# Patient Record
Sex: Female | Born: 1955 | Hispanic: No | Marital: Single | Smoking: Never smoker
Health system: Southern US, Community
[De-identification: ages and names within clinical notes are randomized; demographics above are authoritative.]

## PROBLEM LIST (undated history)

## (undated) DIAGNOSIS — S52501A Unspecified fracture of the lower end of right radius, initial encounter for closed fracture: Secondary | ICD-10-CM

## (undated) HISTORY — PX: ABDOMINAL SURGERY: SHX537

---

## 2002-01-04 ENCOUNTER — Ambulatory Visit (HOSPITAL_BASED_OUTPATIENT_CLINIC_OR_DEPARTMENT_OTHER): Admission: RE | Admit: 2002-01-04 | Discharge: 2002-01-04 | Payer: Self-pay | Admitting: Orthopedic Surgery

## 2011-01-01 ENCOUNTER — Emergency Department (HOSPITAL_BASED_OUTPATIENT_CLINIC_OR_DEPARTMENT_OTHER)
Admission: EM | Admit: 2011-01-01 | Discharge: 2011-01-01 | Disposition: A | Discharge status: Home / Self Care | Payer: 59 | Attending: Emergency Medicine | Admitting: Emergency Medicine

## 2011-01-01 ENCOUNTER — Encounter: Payer: Self-pay | Admitting: Family Medicine

## 2011-01-01 DIAGNOSIS — S41112A Laceration without foreign body of left upper arm, initial encounter: Secondary | ICD-10-CM

## 2011-01-01 DIAGNOSIS — S41109A Unspecified open wound of unspecified upper arm, initial encounter: Secondary | ICD-10-CM | POA: Insufficient documentation

## 2011-01-01 DIAGNOSIS — Y93H2 Activity, gardening and landscaping: Secondary | ICD-10-CM | POA: Insufficient documentation

## 2011-01-01 DIAGNOSIS — W010XXA Fall on same level from slipping, tripping and stumbling without subsequent striking against object, initial encounter: Secondary | ICD-10-CM | POA: Insufficient documentation

## 2011-01-01 DIAGNOSIS — Y92009 Unspecified place in unspecified non-institutional (private) residence as the place of occurrence of the external cause: Secondary | ICD-10-CM | POA: Insufficient documentation

## 2011-01-01 MED ORDER — HYDROCODONE-ACETAMINOPHEN 5-325 MG PO TABS: 2.0000 | ORAL_TABLET | ORAL | Status: AC | PRN

## 2011-01-01 MED ORDER — LIDOCAINE HCL 2 % IJ SOLN
INTRAMUSCULAR | Status: AC
  Administered 2011-01-01: 400 mg
  Filled 2011-01-01: qty 1

## 2011-01-01 MED ORDER — LIDOCAINE HCL 2 % IJ SOLN
20.0000 mL | Freq: Once | INTRAMUSCULAR | Status: AC
  Administered 2011-01-01: 400 mg

## 2011-01-01 NOTE — ED Notes (Signed)
Pt has large deep laceration to LUE from a tree.

## 2011-01-01 NOTE — ED Provider Notes (Signed)
History     CSN: [id number] Arrival date & time: 01/01/2011  4:32 PM   First MD Initiated Contact with Patient 01/01/11 1635      Chief Complaint  Patient presents with  . Extremity Laceration    (Consider location/radiation/quality/duration/timing/severity/associated sxs/prior treatment) Patient is a 55 y.o. female presenting with arm injury. The history is provided by the patient. No language interpreter was used.  Arm Injury  The incident occurred just prior to arrival. The incident occurred at home. The injury mechanism was a fall. The context of the injury is unknown. The wounds were not self-inflicted. No protective equipment was used. She came to the ER via personal transport. There is an injury to the left upper arm. The pain is moderate. It is suspected that a foreign body is present. Pertinent negatives include no numbness and no nausea. There have been no prior injuries to these areas. She is right-handed. Her tetanus status is UTD. She has been behaving normally. There were no sick contacts. She has received no recent medical care.  Pt was trimming tree limbs and slipped and fell,  Pt reports she grabbed tree and tree tore skin on her arm.  History reviewed. No pertinent past medical history.  Past Surgical History  Procedure Date  . Abdominal surgery     No family history on file.  History  Substance Use Topics  . Smoking status: Never Smoker   . Smokeless tobacco: Not on file  . Alcohol Use: No    OB History    Grav Para Term Preterm Abortions TAB SAB Ect Mult Living                  Review of Systems  Gastrointestinal: Negative for nausea.  Skin: Positive for wound.  Neurological: Negative for numbness.  All other systems reviewed and are negative.    Allergies  Review of patient's allergies indicates no known allergies.  Home Medications  No current outpatient prescriptions on file.  BP 141/91  Pulse 99  Temp(Src) 98.1 F (36.7 C) (Oral)   SpO2 100%  Physical Exam  Nursing note and vitals reviewed. Constitutional: She is oriented to person, place, and time. She appears well-developed and well-nourished.  HENT:  Head: Normocephalic.  Eyes: Pupils are equal, round, and reactive to light.  Neck: Normal range of motion.  Cardiovascular: Normal rate.   Pulmonary/Chest: Effort normal.  Musculoskeletal: She exhibits tenderness.       16 cm stellate laceration left arm,  gapping  Neurological: She is alert and oriented to person, place, and time.  Skin: Skin is warm.  Psychiatric: She has a normal mood and affect.    ED Course  LACERATION REPAIR Date/Time: 01/01/2011 5:43 PM Performed by: Langston Masker Authorized by: Langston Masker Consent: Verbal consent obtained. Consent given by: patient Patient understanding: patient states understanding of the procedure being performed Patient identity confirmed: verbally with patient Time out: Immediately prior to procedure a "time out" was called to verify the correct patient, procedure, equipment, support staff and site/side marked as required. Body area: upper extremity Location details: left upper arm Laceration length: 16 cm Contamination: The wound is contaminated. Foreign bodies: wood Tendon involvement: none Nerve involvement: none Vascular damage: no Anesthesia: local infiltration Anesthetic total: 12 ml Patient sedated: no Preparation: Patient was prepped and draped in the usual sterile fashion. Irrigation solution: saline Irrigation method: jet lavage Amount of cleaning: extensive Debridement: none Degree of undermining: none Skin closure: 4-0 Prolene Number of sutures: 18  Technique: simple Approximation: loose Approximation difficulty: complex Patient tolerance: Patient tolerated the procedure well with no immediate complications. Comments: Pt has large area of bruising to skin.  I counseled on hematoma risk.  I will have pt return for 2 day wound check.   Suture removal in 8 days.   (including critical care time)  Labs Reviewed - No data to display No results found.   No diagnosis found.    MDM  Pt counseled on wound care        Langston Masker, [place] 01/01/11 1746  Langston Masker, [place] 01/01/11 1746  Langston Masker, [place] 01/01/11 1747

## 2011-01-01 NOTE — ED Notes (Signed)
Pt's wound cleaned, bacitracin applied, covered with non-adhering dressing, gauze and wrapped with kerlex.

## 2011-01-01 NOTE — ED Provider Notes (Signed)
Medical screening examination/treatment/procedure(s) were performed by non-physician practitioner and as supervising physician I was immediately available for consultation/collaboration.   Celene Kras, MD 01/01/11 [phone]

## 2011-01-12 ENCOUNTER — Emergency Department (HOSPITAL_BASED_OUTPATIENT_CLINIC_OR_DEPARTMENT_OTHER)
Admission: EM | Admit: 2011-01-12 | Discharge: 2011-01-12 | Disposition: A | Discharge status: Home / Self Care | Payer: 59 | Attending: Emergency Medicine | Admitting: Emergency Medicine

## 2011-01-12 ENCOUNTER — Encounter (HOSPITAL_BASED_OUTPATIENT_CLINIC_OR_DEPARTMENT_OTHER): Payer: Self-pay | Admitting: *Deleted

## 2011-01-12 DIAGNOSIS — IMO0001 Reserved for inherently not codable concepts without codable children: Secondary | ICD-10-CM

## 2011-01-12 DIAGNOSIS — Z4802 Encounter for removal of sutures: Secondary | ICD-10-CM | POA: Insufficient documentation

## 2011-01-12 NOTE — ED Provider Notes (Signed)
History     CSN: [id number] Arrival date & time: 01/12/2011 12:12 PM   First MD Initiated Contact with Patient 01/12/11 1247      Chief Complaint  Patient presents with  . Suture / Staple Removal    left forearm    (Consider location/radiation/quality/duration/timing/severity/associated sxs/prior treatment) HPI Patient presents 11 days after suffering a left arm laceration while falling from a ladder. She notes no interval fever, chills, nausea, vomiting, erythema or drainage about the wound. History reviewed. No pertinent past medical history.  Past Surgical History  Procedure Date  . Abdominal surgery     No family history on file.  History  Substance Use Topics  . Smoking status: Never Smoker   . Smokeless tobacco: Not on file  . Alcohol Use: No    OB History    Grav Para Term Preterm Abortions TAB SAB Ect Mult Living                  Review of Systems  All other systems reviewed and are negative.    Allergies  Review of patient's allergies indicates no known allergies.  Home Medications   Current Outpatient Rx  Name Route Sig Dispense Refill  . HYDROCODONE-ACETAMINOPHEN 5-325 MG PO TABS Oral Take 2 tablets by mouth every 4 (four) hours as needed for pain. 20 tablet 0    BP 146/84  Pulse 59  Temp(Src) 98.6 F (37 C) (Oral)  Resp 20  Ht 5\' 7"  (1.702 m)  Wt 140 lb (63.504 kg)  BMI 21.93 kg/m2  SpO2 100%  Physical Exam  Constitutional: She appears well-developed and well-nourished. No distress.  HENT:  Head: Normocephalic and atraumatic.  Eyes: Conjunctivae and EOM are normal.  Musculoskeletal:       On the medial anterior aspect of the left arm there is a stellate laceration of notable length. There is no surrounding erythema nor edema nor focal drainage.  Skin: She is not diaphoretic.    ED Course  Procedures (including critical care time) Sutures removed without complication. The wound remained slightly open in several areas, and the  patient had sterile tape applied to these areas. Labs Reviewed - No data to display No results found.   No diagnosis found.    MDM   This 55 year old female presents 11 days after a laceration for wound revision. Sutures removed without complaint.  Steri-strips applied.        Gerhard Munch, MD 01/12/11 1259

## 2011-01-12 NOTE — ED Notes (Signed)
Placed new steri strips on the Pt. Wound area due to steri strips loose at discharge.  Pt. Has Coban placed on wound area.

## 2011-01-12 NOTE — ED Notes (Signed)
Suture removal from left forearm placed 11 days ago.

## 2018-10-28 ENCOUNTER — Emergency Department (HOSPITAL_COMMUNITY)
Admission: EM | Admit: 2018-10-28 | Discharge: 2018-10-28 | Disposition: A | Discharge status: Home / Self Care | Payer: Self-pay | Attending: Emergency Medicine | Admitting: Emergency Medicine

## 2018-10-28 ENCOUNTER — Emergency Department (HOSPITAL_COMMUNITY): Payer: Self-pay

## 2018-10-28 ENCOUNTER — Encounter (HOSPITAL_COMMUNITY): Payer: Self-pay

## 2018-10-28 ENCOUNTER — Other Ambulatory Visit: Payer: Self-pay

## 2018-10-28 DIAGNOSIS — Y9301 Activity, walking, marching and hiking: Secondary | ICD-10-CM | POA: Insufficient documentation

## 2018-10-28 DIAGNOSIS — S52501A Unspecified fracture of the lower end of right radius, initial encounter for closed fracture: Secondary | ICD-10-CM | POA: Insufficient documentation

## 2018-10-28 DIAGNOSIS — Z20828 Contact with and (suspected) exposure to other viral communicable diseases: Secondary | ICD-10-CM | POA: Insufficient documentation

## 2018-10-28 DIAGNOSIS — W1830XA Fall on same level, unspecified, initial encounter: Secondary | ICD-10-CM | POA: Insufficient documentation

## 2018-10-28 DIAGNOSIS — Y999 Unspecified external cause status: Secondary | ICD-10-CM | POA: Insufficient documentation

## 2018-10-28 DIAGNOSIS — S52611A Displaced fracture of right ulna styloid process, initial encounter for closed fracture: Secondary | ICD-10-CM | POA: Insufficient documentation

## 2018-10-28 DIAGNOSIS — Y929 Unspecified place or not applicable: Secondary | ICD-10-CM | POA: Insufficient documentation

## 2018-10-28 MED ORDER — HYDROCODONE-ACETAMINOPHEN 5-325 MG PO TABS: 1.0000 | ORAL_TABLET | Freq: Four times a day (QID) | ORAL | 0 refills | Status: DC | PRN

## 2018-10-28 MED ORDER — HYDROCODONE-ACETAMINOPHEN 5-325 MG PO TABS
1.0000 | ORAL_TABLET | Freq: Once | ORAL | Status: AC
  Administered 2018-10-28: 17:00:00 1 via ORAL
  Filled 2018-10-28: qty 1

## 2018-10-28 MED ORDER — IBUPROFEN 200 MG PO TABS: 600.0000 mg | ORAL_TABLET | Freq: Four times a day (QID) | ORAL | Status: DC

## 2018-10-28 NOTE — ED Provider Notes (Signed)
[facility] EMER[facility]ENC[facility] DEPARTMENT Provider Note   CSN: [id number] Arrival date & time: 10/28/18  1523     History   Chief Complaint Chief Complaint  Patient presents with  . Wrist Injury  . Fall    HPI Jodi Horn is a 63 y.o. female.     Jodi Horn is a 63 y.o. female who is otherwise healthy, presents to the emergency department from urgent care with a fracture to her right wrist.  She reports around 830 this morning she was walking and slipped on a ramp falling on her outstretched right wrist.  Since the fall she has had severe pain and swelling to the wrist.  Pain is worse with palpation and movement.  She denies numbness tingling or weakness in the hand, pain intermittently shoots up the arm but that has significantly improved since applying ice.  She took Tylenol with minimal improvement in her pain.  Went to [facility] urgent care where they performed an x-ray which showed that she had a wrist fracture, she was sent with a paper printout of a 1 view wrist x-ray which shows fracture, and directed to the ED for further evaluation.  No prior injury or surgery to the wrist.  No other injuries from the fall.  No other aggravating or alleviating factors.     History reviewed. No pertinent past medical history.  There are no active problems to display for this patient.   Past Surgical History:  Procedure Laterality Date  . ABDOMINAL SURGERY       OB History   No obstetric history on file.      Home Medications    Prior to Admission medications   Not on File    Family History No family history on file.  Social History Social History   Tobacco Use  . Smoking status: Never Smoker  Substance Use Topics  . Alcohol use: No  . Drug use: Not on file     Allergies   Patient has no known allergies.   Review of Systems Review of Systems  Constitutional: Negative for chills and fever.  Musculoskeletal: Positive for arthralgias and joint  swelling.  Skin: Negative for color change and rash.  Neurological: Negative for weakness and numbness.     Physical Exam Updated Vital Signs BP (!) 142/85   Pulse 84   Temp 98.2 F (36.8 C) (Oral)   Resp 16   SpO2 98%   Physical Exam Vitals signs and nursing note reviewed.  Constitutional:      General: She is not in acute distress.    Appearance: Normal appearance. She is well-developed and normal weight. She is not ill-appearing or diaphoretic.  HENT:     Head: Normocephalic and atraumatic.  Eyes:     General:        Right eye: No discharge.        Left eye: No discharge.  Pulmonary:     Effort: Pulmonary effort is normal. No respiratory distress.  Musculoskeletal:     Comments: Pain and swelling to the right wrist with obvious deformity, no ecchymosis, 2+ radial pulse and good capillary refill.  Sensation and cardinal hand movements intact.  Grip strength weakened due to pain.  No pain or deformity over the proximal forearm or elbow.  All compartments soft.  All of the joints supple and easily movable.  Skin:    General: Skin is warm and dry.     Capillary Refill: Capillary refill takes less than 2  seconds.  Neurological:     Mental Status: She is alert and oriented to person, place, and time.     Coordination: Coordination normal.  Psychiatric:        Mood and Affect: Mood normal.        Behavior: Behavior normal.      ED Treatments / Results  Labs (all labs ordered are listed, but only abnormal results are displayed) Labs Reviewed - No data to display  EKG None  Radiology Dg Wrist Complete Right  Result Date: 10/28/2018 CLINICAL DATA:  Right wrist pain after fall. EXAM: RIGHT WRIST - COMPLETE 3+ VIEW COMPARISON:  Right wrist x-rays from same day. FINDINGS: Again seen is an acute fracture of the distal radius with mild dorsal displacement and angulation. No definite intra-articular extension. Unchanged acute mildly displaced fracture of the ulnar styloid  process. No additional fracture. No dislocation. Joint spaces are preserved. Bone mineralization is normal. Diffuse soft tissue swelling about the wrist. IMPRESSION: 1. Unchanged acute distal radius and ulnar styloid process fractures. Electronically Signed   By: Obie DredgeWilliam T Derry M.D.   On: 10/28/2018 17:42    Procedures Procedures (including critical care time)  Medications Ordered in ED Medications  HYDROcodone-acetaminophen (NORCO/VICODIN) 5-325 MG per tablet 1 tablet (1 tablet Oral Given 10/28/18 1649)     Initial Impression / Assessment and Plan / ED Course  I have reviewed the triage vital signs and the nursing notes.  Pertinent labs & imaging results that were available during my care of the patient were reviewed by me and considered in my medical decision making (see chart for details).  Patient presents primarily urgent care for evaluation of right wrist fracture, unable to see x-rays from previous visit, so discussed with patient and will need to repeat x-rays here today.  The wrist is swollen and tender to palpation, but neurovascularly intact with strong pulse, good capillary refill, normal sensation and motor function.  Norco given for pain.  X-rays show a displaced distal radius fracture as well as an ulnar styloid process fracture.  Will consult Dr. Janee Mornhompson with hand surgery.  Dr. Janee Mornhompson would like patient to be placed in a sugar tong splint, he will see patient in office and she will likely need ORIF ultimately.  He requests outpatient: Swab be sent.  Discussed ice and elevation with patient.  Splint care and precautions discussed.  Prescribed Norco, discussed Tylenol, in addition to this as patient does not like to take ibuprofen.  Return precautions discussed.  Patient expresses understanding and agreement with plan.  Discharged home in good condition.  Final Clinical Impressions(s) / ED Diagnoses   Final diagnoses:  Closed fracture of distal end of right radius,  unspecified fracture morphology, initial encounter  Closed displaced fracture of styloid process of right ulna, initial encounter    ED Discharge Orders         Ordered    HYDROcodone-acetaminophen (NORCO) 5-325 MG tablet  Every 6 hours PRN     10/28/18 1917    ibuprofen (ADVIL) 200 MG tablet  Every 6 hours     10/28/18 1920           Dartha LodgeFord, Christyn Gutkowski N, PA-C 10/28/18 1923    Sabas SousBero, Michael M, MD 10/29/18 1558

## 2018-10-28 NOTE — Consult Note (Signed)
ORTHOPAEDIC CONSULTATION HISTORY & PHYSICAL REQUESTING PHYSICIAN: Bero, Michael M, MD  Chief Complaint: right wrist inury  HPI: Jodi Horn is a 63 y.o. female who fell onto an outstretched right hand today.  This prompted evaluation in urgent care, and subsequently in the emergency department.  She denies pain elsewhere.  Sugar tong splint is just been applied.  There was reportedly no open wounds.  History reviewed. No pertinent past medical history. Past Surgical History:  Procedure Laterality Date  . ABDOMINAL SURGERY     Social History   Socioeconomic History  . Marital status: Single    Spouse name: Not on file  . Number of children: Not on file  . Years of education: Not on file  . Highest education level: Not on file  Occupational History  . Not on file  Social Needs  . Financial resource strain: Not on file  . Food insecurity    Worry: Not on file    Inability: Not on file  . Transportation needs    Medical: Not on file    Non-medical: Not on file  Tobacco Use  . Smoking status: Never Smoker  Substance and Sexual Activity  . Alcohol use: No  . Drug use: Not on file  . Sexual activity: Not on file  Lifestyle  . Physical activity    Days per week: Not on file    Minutes per session: Not on file  . Stress: Not on file  Relationships  . Social connections    Talks on phone: Not on file    Gets together: Not on file    Attends religious service: Not on file    Active member of club or organization: Not on file    Attends meetings of clubs or organizations: Not on file    Relationship status: Not on file  Other Topics Concern  . Not on file  Social History Narrative  . Not on file   No family history on file. No Known Allergies Prior to Admission medications   Not on File   Dg Wrist Complete Right  Result Date: 10/28/2018 CLINICAL DATA:  Right wrist pain after fall. EXAM: RIGHT WRIST - COMPLETE 3+ VIEW COMPARISON:  Right wrist x-rays from same  day. FINDINGS: Again seen is an acute fracture of the distal radius with mild dorsal displacement and angulation. No definite intra-articular extension. Unchanged acute mildly displaced fracture of the ulnar styloid process. No additional fracture. No dislocation. Joint spaces are preserved. Bone mineralization is normal. Diffuse soft tissue swelling about the wrist. IMPRESSION: 1. Unchanged acute distal radius and ulnar styloid process fractures. Electronically Signed   By: William T Derry M.D.   On: 10/28/2018 17:42    Positive ROS: All other systems have been reviewed and were otherwise negative with the exception of those mentioned in the HPI and as above.  Physical Exam: Vitals: Refer to EMR. Constitutional:  WD, WN, NAD HEENT:  NCAT, EOMI Neuro/Psych:  Alert & oriented to person, place, and time; appropriate mood & affect Lymphatic: No generalized extremity edema or lymphadenopathy Extremities / MSK:  The extremities are normal with respect to appearance, ranges of motion, joint stability, muscle strength/tone, sensation, & perfusion except as otherwise noted:  The right hand fingers are cool, but the contralateral fingers are cool as well.  Capillary refill is brisk.  Intact light touch sensibility in the radial, median, and ulnar nerve distributions with intact motor to the same.  Composite digital flexion nearly fully preserved, as well   as extension.  No tenderness about the elbow to palpation  Assessment: Displaced angulated right metaphyseal distal radius fracture  Plan: I discussed these findings with her.  I reviewed the indications for operative treatment, and recommended such.  Goals, risk, and options were reviewed, and verbal consent obtained today.  We reviewed the indication to obtain and maintain a more anatomic alignment to help optimize functional recovery and minimize risks for pain and post injury dysfunction.  Analgesic plan was reviewed.  Motion exercises were taught and  reviewed.  Instructions for elevation given, and precautions reviewed.    Our surgery coordinator will contact the patient on Monday or Tuesday to arrange further operative care, likely for the 7 to 10-day window.  Rayvon Char [address], [address], [place]  [id number] Office: [phone] Mobile: [phone]  10/28/2018, 6:32 PM

## 2018-10-28 NOTE — Discharge Instructions (Addendum)
Splint until you are seen by hand surgeon, will make sure you keep this clean and dry.   You may take Norco for pain, you can take additional 650 of Tylenol every 6 hours with this medication or ibuprofen.  Ice and elevate the wrist.  Return to the emergency department for significantly worsening pain, numbness or discoloration of the hand or any other new or concerning symptoms.  Remember to elevate your fingers and work on motion as instructed    Cast or Splint Care, Adult Casts and splints are supports that are worn to protect broken bones and other injuries. A cast or splint may hold a bone still and in the correct position while it heals. Casts and splints may also help to ease pain, swelling, and muscle spasms. How to care for your cast   Do not stick anything inside the cast to scratch your skin.  Check the skin around the cast every day. Tell your doctor about any concerns.  You may put lotion on dry skin around the edges of the cast. Do not put lotion on the skin under the cast.  Keep the cast clean.  If the cast is not waterproof: ? Do not let it get wet. ? Cover it with a watertight covering when you take a bath or a shower. How to care for your splint   Wear it as told by your doctor. Take it off only as told by your doctor.  Loosen the splint if your fingers or toes tingle, get numb, or turn cold and blue.  Keep the splint clean.  If the splint is not waterproof: ? Do not let it get wet. ? Cover it with a watertight covering when you take a bath or a shower. Follow these instructions at home: Bathing  Do not take baths or swim until your doctor says it is okay. Ask your doctor if you can take showers. You may only be allowed to take sponge baths for bathing.  If your cast or splint is not waterproof, cover it with a watertight covering when you take a bath or shower. Managing pain, stiffness, and swelling  Move your fingers or toes often to avoid stiffness and  to lessen swelling.  Raise (elevate) the injured area above the level of your heart while sitting or lying down. Safety  Do not use the injured limb to support your body weight until your doctor says that it is okay.  Use crutches or other assistive devices as told by your doctor. General instructions  Do not put pressure on any part of the cast or splint until it is fully hardened. This may take many hours.  Return to your normal activities as told by your doctor. Ask your doctor what activities are safe for you.  Keep all follow-up visits as told by your doctor. This is important. Contact a doctor if:  Your cast or splint gets damaged.  The skin around the cast gets red or raw.  The skin under the cast is very itchy or painful.  Your cast or splint feels very uncomfortable.  Your cast or splint is too tight or too loose.  Your cast becomes wet or it starts to have a soft spot or area.  You get an object stuck under your cast. Get help right away if:  Your pain gets worse.  The injured area tingles, gets numb, or turns blue and cold.  The part of your body above or below the cast is swollen  and it turns a different color (is discolored).  You cannot feel or move your fingers or toes.  There is fluid leaking through the cast.  You have very bad pain or pressure under the cast.  You have trouble breathing.  You have shortness of breath.  You have chest pain. This information is not intended to replace advice given to you by your health care provider. Make sure you discuss any questions you have with your health care provider. Document Released: 07/02/2010 Document Revised: 06/22/2018 Document Reviewed: 02/21/2016 Elsevier Patient Education  2020 [employer]  Acute Compartment Syndrome  Compartment syndrome is a painful condition that occurs when swelling and pressure build up in a body space (compartment) of the arms or legs. Groups of muscles, nerves, and  blood vessels in the arms and legs are separated into various compartments. Each compartment is surrounded by tough layers of tissue (fascia). In compartment syndrome, pressure builds up within the layers of fascia and begins to push on the structures within that compartment. In acute compartment syndrome, the pressure builds up suddenly, often as the result of an injury. If pressure continues to increase, it can block the flow of blood in the smallest blood vessels (capillaries). Then the muscles in the compartment cannot get enough oxygen and nutrients and will start to die within 4-6 hours. The nerves will begin to die within 12-24 hours. This condition is a medical emergency that must be treated with surgery. What are the causes? This condition may be caused by:  Injury. Some injuries can cause swelling or bleeding in a compartment. This can lead to compartment syndrome. Injuries that may cause this problem include: ? Broken bones, especially the long bones of the arms and legs. ? Crushing injuries. ? Penetrating injuries, such as a knife wound. ? Badly bruised muscles. ? Poisonous bites, such as a snake bite. ? Severe burns.  Blocked blood flow. This could be a result of: ? A cast or bandage that is too tight. ? A surgical procedure. Blood flow sometimes has to be stopped for a while during a surgery, usually with a tourniquet. ? Lying for too long in a position that restricts blood flow. This can happen in people who have nerve damage or if a person is unconscious for a long time. ? Medicines used to build up muscles (anabolic steroids). ? Medicines that keep the blood from forming clots (blood thinners). What are the signs or symptoms? The most common symptom of this condition is pain. The pain:  May be far more severe than it should be for the injury you have.  May get worse: ? When moving or stretching the affected body part. ? When the area is pushed or squeezed. ? When raising  (elevating) affected body part above the level of the heart.  May come with a feeling of tingling or burning.  May not get better when you take pain medicine. Other symptoms include:  A feeling of tightness or fullness in the affected area.  A loss of feeling.  Weakness in the area.  Loss of movement.  Skin becoming pale, tight, and shiny over the painful area.  Warmth and tenderness.  Tensing when the affected area is touched. How is this diagnosed? This condition may be diagnosed based on:  Your physical exam and symptoms.  Measuring the pressure in the affected area (compartment pressure measurement).  Tests to rule out other problems, such as: ? X-rays. ? Blood tests. ? Ultrasound. How is this  treated? Treatment for this condition uses a procedure called fasciotomy. In this procedure, incisions are made through the fascia to relieve the pressure in the compartment and to prevent permanent damage. Before the surgery, first-aid treatment is done, which may include:  Treating any injury.  Loosening or removing any cast, bandage, or external wrap that may be causing pain.  Elevating the painful arm or leg to the same level as the heart.  Giving oxygen.  Giving fluids through an IV tube.  Pain medicine. Summary  Compartment syndrome occurs when swelling and pressure build up in a body space (compartment) of the arms or legs.  First aid treatment may include loosening or removing a cast, bandage, or wrap and elevating the painful arm or leg at the level of the heart.  In acute compartment syndrome, the pressure builds up suddenly, often as the result of an injury.  This condition is a medical emergency that must be treated with a surgical procedure called fasciotomy. This procedure relieves the pressure and prevents permanent damage. This information is not intended to replace advice given to you by your health care provider. Make sure you discuss any questions you  have with your health care provider. Document Released: 02/18/2009 Document Revised: 02/12/2017 Document Reviewed: 02/20/2016 Elsevier Patient Education  2020 [employer].

## 2018-10-28 NOTE — H&P (View-Only) (Signed)
ORTHOPAEDIC CONSULTATION HISTORY & PHYSICAL REQUESTING PHYSICIAN: Sabas SousBero, Michael M, MD  Chief Complaint: right wrist inury  HPI: Jodi Horn is a 63 y.o. female who fell onto an outstretched right hand today.  This prompted evaluation in urgent care, and subsequently in the emergency department.  She denies pain elsewhere.  Sugar tong splint is just been applied.  There was reportedly no open wounds.  History reviewed. No pertinent past medical history. Past Surgical History:  Procedure Laterality Date  . ABDOMINAL SURGERY     Social History   Socioeconomic History  . Marital status: Single    Spouse name: Not on file  . Number of children: Not on file  . Years of education: Not on file  . Highest education level: Not on file  Occupational History  . Not on file  Social Needs  . Financial resource strain: Not on file  . Food insecurity    Worry: Not on file    Inability: Not on file  . Transportation needs    Medical: Not on file    Non-medical: Not on file  Tobacco Use  . Smoking status: Never Smoker  Substance and Sexual Activity  . Alcohol use: No  . Drug use: Not on file  . Sexual activity: Not on file  Lifestyle  . Physical activity    Days per week: Not on file    Minutes per session: Not on file  . Stress: Not on file  Relationships  . Social Musicianconnections    Talks on phone: Not on file    Gets together: Not on file    Attends religious service: Not on file    Active member of club or organization: Not on file    Attends meetings of clubs or organizations: Not on file    Relationship status: Not on file  Other Topics Concern  . Not on file  Social History Narrative  . Not on file   No family history on file. No Known Allergies Prior to Admission medications   Not on File   Dg Wrist Complete Right  Result Date: 10/28/2018 CLINICAL DATA:  Right wrist pain after fall. EXAM: RIGHT WRIST - COMPLETE 3+ VIEW COMPARISON:  Right wrist x-rays from same  day. FINDINGS: Again seen is an acute fracture of the distal radius with mild dorsal displacement and angulation. No definite intra-articular extension. Unchanged acute mildly displaced fracture of the ulnar styloid process. No additional fracture. No dislocation. Joint spaces are preserved. Bone mineralization is normal. Diffuse soft tissue swelling about the wrist. IMPRESSION: 1. Unchanged acute distal radius and ulnar styloid process fractures. Electronically Signed   By: Obie DredgeWilliam T Derry M.D.   On: 10/28/2018 17:42    Positive ROS: All other systems have been reviewed and were otherwise negative with the exception of those mentioned in the HPI and as above.  Physical Exam: Vitals: Refer to EMR. Constitutional:  WD, WN, NAD HEENT:  NCAT, EOMI Neuro/Psych:  Alert & oriented to person, place, and time; appropriate mood & affect Lymphatic: No generalized extremity edema or lymphadenopathy Extremities / MSK:  The extremities are normal with respect to appearance, ranges of motion, joint stability, muscle strength/tone, sensation, & perfusion except as otherwise noted:  The right hand fingers are cool, but the contralateral fingers are cool as well.  Capillary refill is brisk.  Intact light touch sensibility in the radial, median, and ulnar nerve distributions with intact motor to the same.  Composite digital flexion nearly fully preserved, as well  as extension.  No tenderness about the elbow to palpation  Assessment: Displaced angulated right metaphyseal distal radius fracture  Plan: I discussed these findings with her.  I reviewed the indications for operative treatment, and recommended such.  Goals, risk, and options were reviewed, and verbal consent obtained today.  We reviewed the indication to obtain and maintain a more anatomic alignment to help optimize functional recovery and minimize risks for pain and post injury dysfunction.  Analgesic plan was reviewed.  Motion exercises were taught and  reviewed.  Instructions for elevation given, and precautions reviewed.    Our surgery coordinator will contact the patient on Monday or Tuesday to arrange further operative care, likely for the 7 to 10-day window.  Rayvon Char [place] Silos, [address], [place]  [id number] Office: [phone] Mobile: [phone]  10/28/2018, 6:32 PM

## 2018-10-28 NOTE — ED Triage Notes (Signed)
Pt tripped and fell earlier today, seen at [facility] with xray showing acute fracture of right wrist. Sling placed in triage for immobilization. Pt a.o

## 2018-10-30 LAB — NOVEL CORONAVIRUS, NAA (HOSP ORDER, SEND-OUT TO REF LAB; TAT 18-24 HRS): SARS-CoV-2, NAA: NOT DETECTED

## 2018-11-01 ENCOUNTER — Other Ambulatory Visit: Payer: Self-pay

## 2018-11-01 ENCOUNTER — Encounter (HOSPITAL_BASED_OUTPATIENT_CLINIC_OR_DEPARTMENT_OTHER): Payer: Self-pay | Admitting: *Deleted

## 2018-11-01 ENCOUNTER — Other Ambulatory Visit: Payer: Self-pay | Admitting: Orthopedic Surgery

## 2018-11-03 ENCOUNTER — Encounter (HOSPITAL_BASED_OUTPATIENT_CLINIC_OR_DEPARTMENT_OTHER)
Admission: RE | Admit: 2018-11-03 | Discharge: 2018-11-03 | Disposition: A | Discharge status: Home / Self Care | Payer: Self-pay | Source: Ambulatory Visit | Attending: Orthopedic Surgery | Admitting: Orthopedic Surgery

## 2018-11-03 ENCOUNTER — Other Ambulatory Visit: Payer: Self-pay

## 2018-11-03 ENCOUNTER — Other Ambulatory Visit (HOSPITAL_COMMUNITY)
Admission: RE | Admit: 2018-11-03 | Discharge: 2018-11-03 | Disposition: A | Discharge status: Home / Self Care | Payer: HRSA Program | Source: Ambulatory Visit | Attending: Orthopedic Surgery | Admitting: Orthopedic Surgery

## 2018-11-03 DIAGNOSIS — Z01812 Encounter for preprocedural laboratory examination: Secondary | ICD-10-CM | POA: Insufficient documentation

## 2018-11-03 DIAGNOSIS — Z20828 Contact with and (suspected) exposure to other viral communicable diseases: Secondary | ICD-10-CM | POA: Diagnosis not present

## 2018-11-03 DIAGNOSIS — S52501A Unspecified fracture of the lower end of right radius, initial encounter for closed fracture: Secondary | ICD-10-CM | POA: Insufficient documentation

## 2018-11-03 DIAGNOSIS — Z01810 Encounter for preprocedural cardiovascular examination: Secondary | ICD-10-CM | POA: Insufficient documentation

## 2018-11-03 LAB — SARS CORONAVIRUS 2 (TAT 6-24 HRS): SARS Coronavirus 2: NEGATIVE

## 2018-11-03 NOTE — Progress Notes (Signed)

## 2018-11-04 NOTE — Progress Notes (Signed)
EKG reviewed by Dr. Ellender, will proceed with surgery as scheduled. 

## 2018-11-07 ENCOUNTER — Ambulatory Visit (HOSPITAL_BASED_OUTPATIENT_CLINIC_OR_DEPARTMENT_OTHER): Payer: Self-pay | Admitting: Certified Registered"

## 2018-11-07 ENCOUNTER — Other Ambulatory Visit: Payer: Self-pay

## 2018-11-07 ENCOUNTER — Ambulatory Visit (HOSPITAL_COMMUNITY): Payer: Self-pay

## 2018-11-07 ENCOUNTER — Encounter (HOSPITAL_BASED_OUTPATIENT_CLINIC_OR_DEPARTMENT_OTHER)
Admission: RE | Disposition: A | Discharge status: Home / Self Care | Payer: Self-pay | Source: Home / Self Care | Attending: Orthopedic Surgery

## 2018-11-07 ENCOUNTER — Ambulatory Visit (HOSPITAL_BASED_OUTPATIENT_CLINIC_OR_DEPARTMENT_OTHER)
Admission: RE | Admit: 2018-11-07 | Discharge: 2018-11-07 | Disposition: A | Discharge status: Home / Self Care | Payer: Self-pay | Attending: Orthopedic Surgery | Admitting: Orthopedic Surgery

## 2018-11-07 ENCOUNTER — Encounter (HOSPITAL_BASED_OUTPATIENT_CLINIC_OR_DEPARTMENT_OTHER): Payer: Self-pay | Admitting: *Deleted

## 2018-11-07 DIAGNOSIS — W19XXXA Unspecified fall, initial encounter: Secondary | ICD-10-CM | POA: Insufficient documentation

## 2018-11-07 DIAGNOSIS — Z4789 Encounter for other orthopedic aftercare: Secondary | ICD-10-CM

## 2018-11-07 DIAGNOSIS — S52551A Other extraarticular fracture of lower end of right radius, initial encounter for closed fracture: Secondary | ICD-10-CM | POA: Insufficient documentation

## 2018-11-07 HISTORY — PX: OPEN REDUCTION INTERNAL FIXATION (ORIF) DISTAL RADIAL FRACTURE: SHX5989

## 2018-11-07 HISTORY — DX: Unspecified fracture of the lower end of right radius, initial encounter for closed fracture: S52.501A

## 2018-11-07 SURGERY — OPEN REDUCTION INTERNAL FIXATION (ORIF) DISTAL RADIUS FRACTURE
Anesthesia: Monitor Anesthesia Care | Site: Wrist | Laterality: Right

## 2018-11-07 MED ORDER — OXYCODONE HCL 5 MG PO TABS: 5.0000 mg | ORAL_TABLET | Freq: Once | ORAL | Status: DC | PRN

## 2018-11-07 MED ORDER — PROPOFOL 500 MG/50ML IV EMUL
INTRAVENOUS | Status: DC | PRN
  Administered 2018-11-07: 100 ug/kg/min via INTRAVENOUS

## 2018-11-07 MED ORDER — ACETAMINOPHEN 160 MG/5ML PO SOLN: 1000.0000 mg | Freq: Once | ORAL | Status: DC | PRN

## 2018-11-07 MED ORDER — CEFAZOLIN SODIUM-DEXTROSE 2-4 GM/100ML-% IV SOLN
2.0000 g | INTRAVENOUS | Status: AC
  Administered 2018-11-07: 2 g via INTRAVENOUS

## 2018-11-07 MED ORDER — FENTANYL CITRATE (PF) 100 MCG/2ML IJ SOLN
50.0000 ug | INTRAMUSCULAR | Status: DC | PRN
  Administered 2018-11-07: 50 ug via INTRAVENOUS

## 2018-11-07 MED ORDER — OXYCODONE HCL 5 MG PO TABS: 5.0000 mg | ORAL_TABLET | Freq: Four times a day (QID) | ORAL | 0 refills | Status: DC | PRN

## 2018-11-07 MED ORDER — MIDAZOLAM HCL 2 MG/2ML IJ SOLN
INTRAMUSCULAR | Status: AC
  Filled 2018-11-07: qty 2

## 2018-11-07 MED ORDER — POVIDONE-IODINE 10 % EX SWAB: 2.0000 "application " | Freq: Once | CUTANEOUS | Status: DC

## 2018-11-07 MED ORDER — ACETAMINOPHEN 325 MG PO TABS: 650.0000 mg | ORAL_TABLET | Freq: Four times a day (QID) | ORAL | Status: DC

## 2018-11-07 MED ORDER — OXYCODONE HCL 5 MG/5ML PO SOLN: 5.0000 mg | Freq: Once | ORAL | Status: DC | PRN

## 2018-11-07 MED ORDER — CLONIDINE HCL (ANALGESIA) 100 MCG/ML EP SOLN
EPIDURAL | Status: DC | PRN
  Administered 2018-11-07: 100 ug

## 2018-11-07 MED ORDER — SCOPOLAMINE 1 MG/3DAYS TD PT72: 1.0000 | MEDICATED_PATCH | Freq: Once | TRANSDERMAL | Status: DC

## 2018-11-07 MED ORDER — LIDOCAINE 2% (20 MG/ML) 5 ML SYRINGE
INTRAMUSCULAR | Status: DC | PRN
  Administered 2018-11-07: 3 mg via INTRAVENOUS

## 2018-11-07 MED ORDER — FENTANYL CITRATE (PF) 100 MCG/2ML IJ SOLN
INTRAMUSCULAR | Status: AC
  Filled 2018-11-07: qty 2

## 2018-11-07 MED ORDER — CEFAZOLIN SODIUM-DEXTROSE 2-4 GM/100ML-% IV SOLN
INTRAVENOUS | Status: AC
  Filled 2018-11-07: qty 100

## 2018-11-07 MED ORDER — FENTANYL CITRATE (PF) 100 MCG/2ML IJ SOLN: 25.0000 ug | INTRAMUSCULAR | Status: DC | PRN

## 2018-11-07 MED ORDER — MIDAZOLAM HCL 2 MG/2ML IJ SOLN
1.0000 mg | INTRAMUSCULAR | Status: DC | PRN
  Administered 2018-11-07: 1 mg via INTRAVENOUS

## 2018-11-07 MED ORDER — ACETAMINOPHEN 10 MG/ML IV SOLN: 1000.0000 mg | Freq: Once | INTRAVENOUS | Status: DC | PRN

## 2018-11-07 MED ORDER — ONDANSETRON HCL 4 MG/2ML IJ SOLN
INTRAMUSCULAR | Status: DC | PRN
  Administered 2018-11-07: 4 mg via INTRAVENOUS

## 2018-11-07 MED ORDER — BUPIVACAINE-EPINEPHRINE (PF) 0.5% -1:200000 IJ SOLN
INTRAMUSCULAR | Status: DC | PRN
  Administered 2018-11-07: 30 mL via PERINEURAL

## 2018-11-07 MED ORDER — LIDOCAINE-EPINEPHRINE (PF) 1.5 %-1:200000 IJ SOLN
INTRAMUSCULAR | Status: DC | PRN
  Administered 2018-11-07: 10 mL via PERINEURAL

## 2018-11-07 MED ORDER — LACTATED RINGERS IV SOLN
INTRAVENOUS | Status: DC
  Administered 2018-11-07: 07:00:00 via INTRAVENOUS

## 2018-11-07 MED ORDER — ACETAMINOPHEN 500 MG PO TABS: 1000.0000 mg | ORAL_TABLET | Freq: Once | ORAL | Status: DC | PRN

## 2018-11-07 SURGICAL SUPPLY — 71 items
APL PRP STRL LF DISP 70% ISPRP (MISCELLANEOUS) ×1
BAND INSRT 18 STRL LF DISP RB (MISCELLANEOUS)
BAND RUBBER #18 3X1/16 STRL (MISCELLANEOUS) IMPLANT
BIT DRILL SOLID 2.0X40MM (BIT) IMPLANT
BIT DRILL SOLID 2.5X40MM (BIT) IMPLANT
BLADE MINI RND TIP GREEN BEAV (BLADE) IMPLANT
BLADE SURG 15 STRL LF DISP TIS (BLADE) ×1 IMPLANT
BLADE SURG 15 STRL SS (BLADE) ×3
BNDG CMPR 9X4 STRL LF SNTH (GAUZE/BANDAGES/DRESSINGS) ×1
BNDG COHESIVE 2X5 TAN STRL LF (GAUZE/BANDAGES/DRESSINGS) IMPLANT
BNDG COHESIVE 4X5 TAN STRL (GAUZE/BANDAGES/DRESSINGS) ×3 IMPLANT
BNDG ESMARK 4X9 LF (GAUZE/BANDAGES/DRESSINGS) ×3 IMPLANT
BNDG GAUZE ELAST 4 BULKY (GAUZE/BANDAGES/DRESSINGS) ×3 IMPLANT
BRUSH SCRUB EZ PLAIN DRY (MISCELLANEOUS) ×2 IMPLANT
CANISTER SUCT 1200ML W/VALVE (MISCELLANEOUS) ×3 IMPLANT
CHLORAPREP W/TINT 26 (MISCELLANEOUS) ×3 IMPLANT
CORD BIPOLAR FORCEPS 12FT (ELECTRODE) ×3 IMPLANT
COVER BACK TABLE REUSABLE LG (DRAPES) ×3 IMPLANT
COVER MAYO STAND REUSABLE (DRAPES) ×3 IMPLANT
COVER WAND RF STERILE (DRAPES) IMPLANT
CUFF TOURN SGL QUICK 18X4 (TOURNIQUET CUFF) ×2 IMPLANT
CUFF TOURN SGL QUICK 24 (TOURNIQUET CUFF)
CUFF TRNQT CYL 24X4X16.5-23 (TOURNIQUET CUFF) IMPLANT
DRAPE C-ARM 42X72 X-RAY (DRAPES) ×3 IMPLANT
DRAPE EXTREMITY T 121X128X90 (DISPOSABLE) ×3 IMPLANT
DRAPE SURG 17X23 STRL (DRAPES) ×3 IMPLANT
DRILL SOLID 2.0X40MM (BIT)
DRILL SOLID 2.5X40MM (BIT)
DRSG ADAPTIC 3X8 NADH LF (GAUZE/BANDAGES/DRESSINGS) ×3 IMPLANT
DRSG EMULSION OIL 3X3 NADH (GAUZE/BANDAGES/DRESSINGS) IMPLANT
ELECT REM PT RETURN 9FT ADLT (ELECTROSURGICAL) ×3
ELECTRODE REM PT RTRN 9FT ADLT (ELECTROSURGICAL) ×1 IMPLANT
GAUZE SPONGE 4X4 12PLY STRL LF (GAUZE/BANDAGES/DRESSINGS) ×3 IMPLANT
GLOVE BIO SURGEON STRL SZ7.5 (GLOVE) ×3 IMPLANT
GLOVE BIOGEL PI IND STRL 7.0 (GLOVE) ×1 IMPLANT
GLOVE BIOGEL PI IND STRL 8 (GLOVE) ×1 IMPLANT
GLOVE BIOGEL PI INDICATOR 7.0 (GLOVE) ×6
GLOVE BIOGEL PI INDICATOR 8 (GLOVE) ×2
GLOVE ECLIPSE 6.5 STRL STRAW (GLOVE) ×5 IMPLANT
GOWN STRL REUS W/ TWL LRG LVL3 (GOWN DISPOSABLE) ×2 IMPLANT
GOWN STRL REUS W/TWL LRG LVL3 (GOWN DISPOSABLE) ×6
GOWN STRL REUS W/TWL XL LVL3 (GOWN DISPOSABLE) ×3 IMPLANT
GUIDE AIMING 1.5MM (WIRE) ×2 IMPLANT
NDL HYPO 25X1 1.5 SAFETY (NEEDLE) IMPLANT
NEEDLE HYPO 25X1 1.5 SAFETY (NEEDLE) IMPLANT
NS IRRIG 1000ML POUR BTL (IV SOLUTION) ×3 IMPLANT
PACK BASIN DAY SURGERY FS (CUSTOM PROCEDURE TRAY) ×3 IMPLANT
PADDING CAST ABS 4INX4YD NS (CAST SUPPLIES) ×2
PADDING CAST ABS COTTON 4X4 ST (CAST SUPPLIES) IMPLANT
PENCIL BUTTON HOLSTER BLD 10FT (ELECTRODE) ×3 IMPLANT
SKELETAL DYNAMICS DVR SET (Set) ×2 IMPLANT
SLEEVE SCD COMPRESS KNEE MED (MISCELLANEOUS) ×3 IMPLANT
SLING ARM FOAM STRAP LRG (SOFTGOODS) IMPLANT
SPLINT PLASTER CAST XFAST 3X15 (CAST SUPPLIES) IMPLANT
SPLINT PLASTER XTRA FASTSET 3X (CAST SUPPLIES) ×14
STOCKINETTE 6  STRL (DRAPES) ×2
STOCKINETTE 6 STRL (DRAPES) ×1 IMPLANT
SUCTION FRAZIER HANDLE 10FR (MISCELLANEOUS) ×2
SUCTION TUBE FRAZIER 10FR DISP (MISCELLANEOUS) ×1 IMPLANT
SUT VIC AB 2-0 PS2 27 (SUTURE) ×3 IMPLANT
SUT VICRYL 4-0 PS2 18IN ABS (SUTURE) IMPLANT
SUT VICRYL RAPIDE 4-0 (SUTURE) ×2 IMPLANT
SUT VICRYL RAPIDE 4/0 PS 2 (SUTURE) ×3 IMPLANT
SYR 10ML LL (SYRINGE) IMPLANT
SYR BULB 3OZ (MISCELLANEOUS) ×3 IMPLANT
TOWEL GREEN STERILE FF (TOWEL DISPOSABLE) ×3 IMPLANT
TUBE CONNECTING 20'X1/4 (TUBING) ×1
TUBE CONNECTING 20X1/4 (TUBING) ×2 IMPLANT
UNDERPAD 30X30 (UNDERPADS AND DIAPERS) ×3 IMPLANT
WIRE FIX 1.5 STANDARD TIP (WIRE)
WIRE FIX 1.5 STD TIP (WIRE) IMPLANT

## 2018-11-07 NOTE — Anesthesia Preprocedure Evaluation (Signed)
Anesthesia Evaluation  Patient identified by MRN, date of birth, ID band Patient awake    Reviewed: Allergy & Precautions, NPO status , Patient's Chart, lab work & pertinent test results  Airway Mallampati: II  TM Distance: >3 FB Neck ROM: Full    Dental  (+) Dental Advisory Given   Pulmonary neg pulmonary ROS, neg recent URI, Not current smoker,    breath sounds clear to auscultation       Cardiovascular negative cardio ROS   Rhythm:Regular     Neuro/Psych negative neurological ROS  negative psych ROS   GI/Hepatic negative GI ROS, Neg liver ROS,   Endo/Other  negative endocrine ROS  Renal/GU negative Renal ROS     Musculoskeletal RIGHT DISTAL RADIUS FRACTURE   Abdominal   Peds  Hematology   Anesthesia Other Findings   Reproductive/Obstetrics                             Anesthesia Physical Anesthesia Plan  ASA: I  Anesthesia Plan: MAC and Regional   Post-op Pain Management:    Induction:   PONV Risk Score and Plan: 2 and Propofol infusion and Treatment may vary due to age or medical condition  Airway Management Planned: Nasal Cannula  Additional Equipment:   Intra-op Plan:   Post-operative Plan:   Informed Consent: I have reviewed the patients History and Physical, chart, labs and discussed the procedure including the risks, benefits and alternatives for the proposed anesthesia with the patient or authorized representative who has indicated his/her understanding and acceptance.     Dental advisory given  Plan Discussed with: CRNA and Surgeon  Anesthesia Plan Comments:         Anesthesia Quick Evaluation

## 2018-11-07 NOTE — Transfer of Care (Signed)
Immediate Anesthesia Transfer of Care Note  Patient: Jodi Horn  Procedure(s) Performed: OPEN REDUCTION INTERNAL FIXATION (ORIF) DISTAL RADIAL FRACTURE (Right Wrist)  Patient Location: PACU  Anesthesia Type:MAC combined with regional for post-op pain  Level of Consciousness: awake, alert , oriented and patient cooperative  Airway & Oxygen Therapy: Patient Spontanous Breathing and Patient connected to face mask oxygen  Post-op Assessment: Report given to RN and Post -op Vital signs reviewed and stable  Post vital signs: Reviewed and stable  Last Vitals:  Vitals Value Taken Time  BP    Temp    Pulse 86 11/07/18 0846  Resp 18 11/07/18 0846  SpO2 98 % 11/07/18 0846  Vitals shown include unvalidated device data.  Last Pain:  Vitals:   11/07/18 0647  PainSc: 2          Complications: No apparent anesthesia complications

## 2018-11-07 NOTE — Anesthesia Procedure Notes (Signed)
Procedure Name: MAC Date/Time: 11/07/2018 7:49 AM Performed by: Signe Colt, CRNA Pre-anesthesia Checklist: Patient identified, Emergency Drugs available, Suction available, Patient being monitored and Timeout performed Patient Re-evaluated:Patient Re-evaluated prior to induction Oxygen Delivery Method: Simple face mask

## 2018-11-07 NOTE — Discharge Instructions (Signed)
Discharge Instructions   You have a dressing with a plaster splint incorporated in it. Move your fingers as much as possible, making a full fist and fully opening the fist. Elevate your hand with finger tip towards the ceiling to reduce pain & swelling of the digits.  Ice over the operative site may be helpful to reduce pain & swelling.  DO NOT USE HEAT. Pain medicine has been prescribed for you.  Take Tylenol 650 mg and Ibuprofen 600 mg TOGETHER every 6 hours consistently. Take Oxycodone 5 mg additionally for severe breakthrough pain. Leave the dressing in place until you return to our office.  You may shower, but keep the bandage clean & dry.  You may drive a car when you are off of prescription pain medications and can safely control your vehicle with both hands. Our office will call you to arrange follow-up   Please call [phone] during normal business hours or [phone] after hours for any problems. Including the following:  - excessive redness of the incisions - drainage for more than 4 days - fever of more than 101.5 F  *Please note that pain medications will not be refilled after hours or on weekends.      Post Anesthesia Home Care Instructions  Activity: Get plenty of rest for the remainder of the day. A responsible individual must stay with you for 24 hours following the procedure.  For the next 24 hours, DO NOT: -Drive a car -Paediatric nurse -Drink alcoholic beverages -Take any medication unless instructed by your physician -Make any legal decisions or sign important papers.  Meals: Start with liquid foods such as gelatin or soup. Progress to regular foods as tolerated. Avoid greasy, spicy, heavy foods. If nausea and/or vomiting occur, drink only clear liquids until the nausea and/or vomiting subsides. Call your physician if vomiting continues.  Special Instructions/Symptoms: Your throat may feel dry or sore from the anesthesia or the breathing tube placed  in your throat during surgery. If this causes discomfort, gargle with warm salt water. The discomfort should disappear within 24 hours.  If you had a scopolamine patch placed behind your ear for the management of post- operative nausea and/or vomiting:  1. The medication in the patch is effective for 72 hours, after which it should be removed.  Wrap patch in a tissue and discard in the trash. Wash hands thoroughly with soap and water. 2. You may remove the patch earlier than 72 hours if you experience unpleasant side effects which may include dry mouth, dizziness or visual disturbances. 3. Avoid touching the patch. Wash your hands with soap and water after contact with the patch.       Regional Anesthesia Blocks  1. Numbness or the inability to move the "blocked" extremity may last from 3-48 hours after placement. The length of time depends on the medication injected and your individual response to the medication. If the numbness is not going away after 48 hours, call your surgeon.  2. The extremity that is blocked will need to be protected until the numbness is gone and the  Strength has returned. Because you cannot feel it, you will need to take extra care to avoid injury. Because it may be weak, you may have difficulty moving it or using it. You may not know what position it is in without looking at it while the block is in effect.  3. For blocks in the legs and feet, returning to weight bearing and walking needs to be done carefully.  You will need to wait until the numbness is entirely gone and the strength has returned. You should be able to move your leg and foot normally before you try and bear weight or walk. You will need someone to be with you when you first try to ensure you do not fall and possibly risk injury.  4. Bruising and tenderness at the needle site are common side effects and will resolve in a few days.  5. Persistent numbness or new problems with movement should be  communicated to the surgeon or the [facility] [phone])/ [facility] [phone]).

## 2018-11-07 NOTE — Op Note (Signed)
11/07/2018  7:34 AM  PATIENT:  Jodi Horn  64 y.o. female  PRE-OPERATIVE DIAGNOSIS:  Displaced right extra-articular distal radius fracture  POST-OPERATIVE DIAGNOSIS:  Same  PROCEDURE:  ORIF right extra-articular distal radius fracture, 25607  SURGEON: Rayvon Char. [place] Silos, MD  PHYSICIAN ASSISTANT: Colette Ribas  ANESTHESIA:  regional and MAC  SPECIMENS:  None  DRAINS: None  EBL:  less than 50 mL  PREOPERATIVE INDICATIONS:  Jodi Horn is a  63 y.o. female with a displaced right distal radius fracture  The risks benefits and alternatives were discussed with the patient preoperatively including but not limited to the risks of infection, bleeding, nerve injury, cardiopulmonary complications, the need for revision surgery, among others, and the patient verbalized understanding and consented to proceed.  OPERATIVE IMPLANTS: [employer] Geminus plate/screws/pegs  OPERATIVE PROCEDURE: After receiving prophylactic antibiotics and a regional block, the patient was escorted to the operative theatre and placed in a supine position.   A surgical "time-out" was performed during which the planned procedure, proposed operative site, and the correct patient identity were compared to the operative consent and agreement confirmed by the circulating nurse according to current facility policy. Following application of a tourniquet to the operative extremity, the exposed skin was pre-scrubbed with Hibiclens scrub brush and then was prepped with Chloraprep and draped in the usual sterile fashion. The limb was exsanguinated with an Esmarch bandage and the tourniquet inflated to approximately 152mHg higher than systolic BP.   A sinusoidal-shaped incision was marked and made over the FCR axis and the distal forearm. The skin was incised sharply with scalpel, subcutaneous tissues with blunt and spreading dissection. The FCR axis was exploited deeply. The pronator quadratus was reflected  in an L-shaped ulnarly for later reapproximation. The fracture was inspected and provisionally reduced.  This was confirmed fluoroscopically. The appropriately sized plate was selected and found to fit well. It was placed in its provisional alignment of the radius and this was confirmed fluoroscopically.  It was secured to the radius with a screw through the slotted hole.  Additional adjustments were made as necessary, and the distal holes were all drilled and filled.  Peg/screw length distally was selected on the shorter side of measurements to minimize the risk for dorsal cortical penetration. The remainder of the proximal holes were drilled and filled.   Final images were obtained and the DRUJ was examined for stability. It was found to be sufficiently stable. The wound was then copiously irrigated and the PQ repaired with 2-0 Vicryl Rapide suture. Tourniquet was released and additional hemostasis obtained and the skin was closed with 2-0 Vicryl deep dermal buried sutures followed by running 4-0 Vicryl Rapide horizontal mattress suture in the skin. A bulky dressing with a volar plaster component was applied and the patient was taken to the recovery room in stable condition.  DISPOSITION: The patient will be discharged home today with typical post-op instructions, returning in 10-15 days for reevaluation with new x-rays of the affected wrist out of the splint to include an inclined lateral and then transition to [facility] to have a custom splint constructed and begin rehabilitation.

## 2018-11-07 NOTE — Anesthesia Postprocedure Evaluation (Signed)
Anesthesia Post Note  Patient: Jodi Horn  Procedure(s) Performed: OPEN REDUCTION INTERNAL FIXATION (ORIF) DISTAL RADIAL FRACTURE (Right Wrist)     Patient location during evaluation: PACU Anesthesia Type: Regional and MAC Level of consciousness: awake and alert Pain management: pain level controlled Vital Signs Assessment: post-procedure vital signs reviewed and stable Respiratory status: spontaneous breathing, nonlabored ventilation, respiratory function stable and patient connected to nasal cannula oxygen Cardiovascular status: stable and blood pressure returned to baseline Postop Assessment: no apparent nausea or vomiting Anesthetic complications: no    Last Vitals:  Vitals:   11/07/18 0902 11/07/18 0930  BP: 106/71 121/78  Pulse: 73 68  Resp: 19 16  Temp:  37.1 C  SpO2: 96%     Last Pain:  Vitals:   11/07/18 0930  PainSc: 0-No pain                 Shandi Godfrey

## 2018-11-07 NOTE — Progress Notes (Signed)
Assisted Dr. Moser with right, ultrasound guided, axillary block. Side rails up, monitors on throughout procedure. See vital signs in flow sheet. Tolerated Procedure well. 

## 2018-11-07 NOTE — Interval H&P Note (Signed)
History and Physical Interval Note:  11/07/2018 7:33 AM  Jodi Horn  has presented today for surgery, with the diagnosis of RIGHT DISTAL RADIUS FRACTURE S52.501A.  The various methods of treatment have been discussed with the patient and family. After consideration of risks, benefits and other options for treatment, the patient has consented to  Procedure(s) with comments: OPEN REDUCTION INTERNAL FIXATION (ORIF) DISTAL RADIAL FRACTURE (Right) - MAC VS GENERAL ANESTHESIA  PRE OP BLOCK 90 MIN SURGERY REQUEST TIME as a surgical intervention.  The patient's history has been reviewed, patient examined, no change in status, stable for surgery.  I have reviewed the patient's chart and labs.  Questions were answered to the patient's satisfaction.     [name]

## 2018-11-07 NOTE — Anesthesia Procedure Notes (Signed)
Anesthesia Regional Block: Axillary brachial plexus block   Pre-Anesthetic Checklist: ,, timeout performed, Correct Patient, Correct Site, Correct Laterality, Correct Procedure, Correct Position, site marked, Risks and benefits discussed,  Surgical consent,  Pre-op evaluation,  At surgeon's request and post-op pain management  Laterality: Right and Upper  Prep: chloraprep       Needles:  Injection technique: Single-shot     Needle Length: 9cm  Needle Gauge: 22     Additional Needles: Arrow StimuQuik ECHO Echogenic Stimulating PNB Needle  Procedures:,,,, ultrasound used (permanent image in chart),,,,  Narrative:  Start time: 11/07/2018 7:05 AM End time: 11/07/2018 7:15 AM Injection made incrementally with aspirations every 5 mL.  Performed by: Personally  Anesthesiologist: Oleta Mouse, MD

## 2018-11-09 ENCOUNTER — Encounter (HOSPITAL_BASED_OUTPATIENT_CLINIC_OR_DEPARTMENT_OTHER): Payer: Self-pay | Admitting: Orthopedic Surgery

## 2018-11-15 ENCOUNTER — Ambulatory Visit: Payer: Self-pay | Admitting: Occupational Therapy

## 2018-11-16 ENCOUNTER — Ambulatory Visit: Payer: Self-pay | Attending: Orthopedic Surgery | Admitting: Occupational Therapy

## 2018-11-16 ENCOUNTER — Other Ambulatory Visit: Payer: Self-pay

## 2018-11-16 DIAGNOSIS — M25531 Pain in right wrist: Secondary | ICD-10-CM | POA: Insufficient documentation

## 2018-11-16 DIAGNOSIS — M25631 Stiffness of right wrist, not elsewhere classified: Secondary | ICD-10-CM | POA: Insufficient documentation

## 2018-11-16 DIAGNOSIS — R278 Other lack of coordination: Secondary | ICD-10-CM | POA: Insufficient documentation

## 2018-11-16 DIAGNOSIS — M6281 Muscle weakness (generalized): Secondary | ICD-10-CM | POA: Insufficient documentation

## 2018-11-16 NOTE — Patient Instructions (Addendum)
Wrist Flexion / Extension in Pronation / Supination    Rt Forearm on armrest, palms down, move ONE wrist up and down. Hold each position _3__ seconds. Repeat _15__ times, alternating hands. Do _6__ sessions per day.   AROM: Wrist Radial / Ulnar Deviation    Gently bend left wrist from side to side as far as possible. Repeat _15___ times per set. Do _1__ sets per session. Do _6___ sessions per day.  Combination Movement (Active)    Keep elbow firmly at side with wrist straight. Turn palm up and down slowly Repeat _15___ times. Do _6___ sessions per day.  AROM: Thumb Flexion / Extension    Actively bend right thumb across palm as far as possible. Hold __3__ seconds. Relax. Then pull thumb back into hitchhike position. Repeat _15___ times per set.  Do _6___ sessions per day.   WEARING SCHEDULE:  Wear splint at ALL times except for hygiene care (May remove splint for exercises and then immediately place back on ONLY if directed by the therapist)  PURPOSE:  To prevent movement and for protection until injury can heal  CARE OF SPLINT:  Keep splint away from heat sources including: stove, radiator or furnace, or a car in sunlight. The splint can melt and will no longer fit you properly  Keep away from pets and children  Clean the splint with rubbing alcohol 1-2 times per day.  * During this time, make sure you also clean your hand/arm as instructed by your therapist and/or perform dressing changes as needed. Then dry hand/arm completely before replacing splint. (When cleaning hand/arm, keep it immobilized in same position until splint is replaced)  PRECAUTIONS/POTENTIAL PROBLEMS: *If you notice or experience increased pain, swelling, numbness, or a lingering reddened area from the splint: Contact your therapist immediately by calling [phone]. You must wear the splint for protection, but we will get you scheduled for adjustments as quickly as possible.  (If only straps or hooks  need to be replaced and NO adjustments to the splint need to be made, just call the office ahead and let them know you are coming in)  If you have any medical concerns or signs of infection, please call your doctor immediately

## 2018-11-16 NOTE — Therapy (Signed)
[facility] [address] [place], [place], [address] Phone: [phone]   Fax:  [phone]  Occupational Therapy Evaluation  Patient Details  Name: Jodi Horn MRN: [id number] Date of Birth: 10/05/1955 Referring Provider (OT): Dr. Milly Jakob   Encounter Date: 11/16/2018  OT End of Session - 11/16/18 1223    Visit Number  1    Number of Visits  8    Date for OT Re-Evaluation  01/13/19    Authorization Type  Self pay    OT Start Time  1015    OT Stop Time  1135    OT Time Calculation (min)  80 min    Activity Tolerance  Patient tolerated treatment well    Behavior During Therapy  [facility] for tasks assessed/performed       Past Medical History:  Diagnosis Date  . Closed fracture of right distal radius     Past Surgical History:  Procedure Laterality Date  . ABDOMINAL SURGERY     ovarian cyst  . OPEN REDUCTION INTERNAL FIXATION (ORIF) DISTAL RADIAL FRACTURE Right 11/07/2018   Procedure: OPEN REDUCTION INTERNAL FIXATION (ORIF) DISTAL RADIAL FRACTURE;  Surgeon: Milly Jakob, MD;  Location: [facility];  Service: Orthopedics;  Laterality: Right;  MAC VS GENERAL ANESTHESIA  PRE OP BLOCK 90 MIN SURGERY REQUEST TIME    There were no vitals filed for this visit.  Subjective Assessment - 11/16/18 1025    Subjective   My wrist doesn't really hurt right now    Pertinent History  ORIF Rt distal radius fx 11/07/18    Currently in Pain?  No/denies        [facility] OT Assessment - 11/16/18 0001      Assessment   Medical Diagnosis  s/p ORIF Rt distal radius fx    Referring Provider (OT)  Dr. Milly Jakob    Onset Date/Surgical Date  11/07/18    Hand Dominance  Right      Precautions   Precautions  Other (comment)    Precaution Comments  no P/ROM or strengthening at this time    Required Braces or Orthoses  Other Brace/Splint    Other Brace/Splint  wrist splint to be worn at all times except ex's  and showering/hygiene care      Restrictions   Weight Bearing Restrictions  Yes      Home  Environment   Lives With  Alone      Prior Function   Level of Independence  Independent    Vocation  Unemployed      ADL   ADL comments  Pt reports doing all ADLS/IALDS at mod I level      Written Expression   Dominant Hand  Right      Edema   Edema  mild       ROM / Strength   AROM / PROM / Strength  AROM      AROM   Overall AROM Comments  wrist flex = 40*, ext = 20*, RD = 15*, UD = 28*, sup = 48*, pron = 65*               OT Treatments/Exercises (OP) - 11/16/18 0001      ADLs   ADL Comments  Carefully removed soft cast and cleaned incision area prior to splint fabrication. Reviewed precautions, splint wear and care, and hygiene care with patient.       Exercises   Exercises  Wrist  Wrist Exercises   Other wrist exercises  see pt instructions for details on A/ROM ex's. Pt performed each 15 reps      Splinting   Splinting  Fabricated and fitted volar forearm based splint with wrist neutral per MD orders. Reviewed wear and care and issued splint            OT Education - 11/16/18 1118    Education Details  A/ROM HEP, Splint wear and care, precautions    Person(s) Educated  Patient    Methods  Explanation;Demonstration;Handout    Comprehension  Verbalized understanding;Returned demonstration       OT Short Term Goals - 11/16/18 1242      OT SHORT TERM GOAL #1   Title  Independent with HEP    Time  4    Period  Weeks    Status  On-going      OT SHORT TERM GOAL #2   Title  Improve wrist extension by 10* or greater    Baseline  20*    Time  4    Period  Weeks    Status  New      OT SHORT TERM GOAL #3   Title  Improve forearm sup/pron each by 10*    Baseline  sup = 48*, pron = 65*    Time  4    Period  Weeks    Status  New      OT SHORT TERM GOAL #4   Title  Pt independent with splint wear and care    Time  4    Period  Weeks    Status   On-going        OT Long Term Goals - 11/16/18 1244      OT LONG TERM GOAL #1   Title  Independent with updated strengthening HEP    Time  8    Period  Weeks    Status  New      OT LONG TERM GOAL #2   Title  Pt to demo wrist and forearm motion WFL's to perform ADLS    Time  8    Period  Weeks    Status  New      OT LONG TERM GOAL #3   Title  Pt to return to using RT hand as dominant hand for all BADLS    Time  8    Period  Weeks    Status  New      OT LONG TERM GOAL #4   Title  Pt to demo 30 lbs grip strength or greater Rt hand to open jars/ containers    Time  8    Period  Weeks    Status  New            Plan - 11/16/18 1224    Clinical Impression Statement  Pt is a 63 y.o. female who presents to outpatient rehab s/p ORIF Rt distal radius fx on 11/07/18. Pt arrives today with soft post surgical cast. Pt would benefit from O.T. to address splinting, ROM, strength, and functional use of Rt dominant hand    OT Occupational Profile and History  Problem Focused Assessment - Including review of records relating to presenting problem    Occupational performance deficits (Please refer to evaluation for details):  ADL's;IADL's    Body Structure / Function / Physical Skills  ROM;IADL;Edema;Scar mobility;Sensation;Strength;Coordination;Pain;UE functional use    Rehab Potential  Good    Clinical Decision Making  Limited  treatment options, no task modification necessary    Comorbidities Affecting Occupational Performance:  May have comorbidities impacting occupational performance    Modification or Assistance to Complete Evaluation   No modification of tasks or assist necessary to complete eval    OT Frequency  1x / week   due to self pay (however pt only wishes to come every other week d/t self pay)   OT Duration  8 weeks    OT Treatment/Interventions  Self-care/ADL training;Therapeutic exercise;Ultrasound;Manual Therapy;Splinting;Therapeutic activities;DME and/or AE  instruction;Paraffin;Cryotherapy;Fluidtherapy;Electrical Stimulation;Scar mobilization;Moist Heat;Contrast Bath;Passive range of motion;Patient/family education    Plan  review A/ROM HEP prn, splint adjustments prn, begin self guided P/ROM    Consulted and Agree with Plan of Care  Patient       Patient will benefit from skilled therapeutic intervention in order to improve the following deficits and impairments:   Body Structure / Function / Physical Skills: ROM, IADL, Edema, Scar mobility, Sensation, Strength, Coordination, Pain, UE functional use       Visit Diagnosis: Stiffness of right wrist, not elsewhere classified  Muscle weakness (generalized)  Pain in right wrist  Other lack of coordination    Problem List There are no active problems to display for this patient.   Carey Bullocks, OTR/L 11/16/2018, 12:50 PM  [facility] [address] [place], [place], [address] Phone: [phone]   Fax:  [phone]  Name: Jodi Horn MRN: [id number] Date of Birth: 1956-01-16

## 2018-11-29 ENCOUNTER — Other Ambulatory Visit: Payer: Self-pay

## 2018-11-29 ENCOUNTER — Ambulatory Visit: Payer: Self-pay | Admitting: Occupational Therapy

## 2018-11-29 DIAGNOSIS — M25631 Stiffness of right wrist, not elsewhere classified: Secondary | ICD-10-CM

## 2018-11-29 DIAGNOSIS — M25531 Pain in right wrist: Secondary | ICD-10-CM

## 2018-11-29 NOTE — Patient Instructions (Signed)
  Scar massage:  Perform deep circles along incision 2x/day for 5 minutes using cocoa butter or a vitamin E cream. Avoid any open areas.  Call MD if redness spreads, becomes hot to touch, inflammed, or oozing yellow pus  Refer to handout for edema managment

## 2018-11-29 NOTE — Therapy (Signed)
[facility] [address] [place], [place], [address] Phone: [phone]   Fax:  [phone]  Occupational Therapy Treatment  Patient Details  Name: Jodi Horn MRN: [id number] Date of Birth: 27-Dec-1955 Referring Provider (OT): Dr. Milly Jakob   Encounter Date: 11/29/2018  OT End of Session - 11/29/18 0930    Visit Number  2    Number of Visits  8    Date for OT Re-Evaluation  01/13/19    Authorization Type  Self pay    OT Start Time  0845    OT Stop Time  0930    OT Time Calculation (min)  45 min    Activity Tolerance  Patient tolerated treatment well    Behavior During Therapy  [facility] for tasks assessed/performed       Past Medical History:  Diagnosis Date  . Closed fracture of right distal radius     Past Surgical History:  Procedure Laterality Date  . ABDOMINAL SURGERY     ovarian cyst  . OPEN REDUCTION INTERNAL FIXATION (ORIF) DISTAL RADIAL FRACTURE Right 11/07/2018   Procedure: OPEN REDUCTION INTERNAL FIXATION (ORIF) DISTAL RADIAL FRACTURE;  Surgeon: Milly Jakob, MD;  Location: [facility];  Service: Orthopedics;  Laterality: Right;  MAC VS GENERAL ANESTHESIA  PRE OP BLOCK 90 MIN SURGERY REQUEST TIME    There were no vitals filed for this visit.  Subjective Assessment - 11/29/18 0853    Subjective   I have a little open spot that keeps opening and bleeding mildly    Pertinent History  ORIF Rt distal radius fx 11/07/18    Currently in Pain?  No/denies        Reviewed hygiene care and instructed to monitor redness around incision site as pt demo mild redness and one open area. Reviewed s/s of infection and instructed to call MD if pt demo. Advised that pt clean forearm more frequently and allow time to dry completely before donning splint back on. Also noted mild edema Rt hand, and issued edema management strategies: including elevation, retrograde massage, and ROM/movement.  Pt  shown thumb circumduction ex and instructed to add to HEP. Pt performing A/ROM and self guided P/ROM in wrist flex, ext, RD, UD, and forearm supination and pronation, as well as thumb flex and ext. Pt also instructed to keep moving fingers in splint to help monitor edema. Forearm gym x 5 reps for wrist and forearm A/ROM  Adjusted splint to flare away from skin more on radial side of forearm d/t irritation and add padding at thumb for increased comfort. Pt also provided w/ new stockinette, and dry 2x2 gauze pads to place over one small open area.                      OT Education - 11/29/18 0929    Education Details  scar massage, edema management, self guided P/ROM    Person(s) Educated  Patient    Methods  Explanation;Demonstration;Handout    Comprehension  Verbalized understanding;Returned demonstration       OT Short Term Goals - 11/16/18 1242      OT SHORT TERM GOAL #1   Title  Independent with HEP    Time  4    Period  Weeks    Status  On-going      OT SHORT TERM GOAL #2   Title  Improve wrist extension by 10* or greater    Baseline  20*  Time  4    Period  Weeks    Status  New      OT SHORT TERM GOAL #3   Title  Improve forearm sup/pron each by 10*    Baseline  sup = 48*, pron = 65*    Time  4    Period  Weeks    Status  New      OT SHORT TERM GOAL #4   Title  Pt independent with splint wear and care    Time  4    Period  Weeks    Status  On-going        OT Long Term Goals - 11/16/18 1244      OT LONG TERM GOAL #1   Title  Independent with updated strengthening HEP    Time  8    Period  Weeks    Status  New      OT LONG TERM GOAL #2   Title  Pt to demo wrist and forearm motion WFL's to perform ADLS    Time  8    Period  Weeks    Status  New      OT LONG TERM GOAL #3   Title  Pt to return to using RT hand as dominant hand for all BADLS    Time  8    Period  Weeks    Status  New      OT LONG TERM GOAL #4   Title  Pt to demo 30 lbs  grip strength or greater Rt hand to open jars/ containers    Time  8    Period  Weeks    Status  New            Plan - 11/29/18 1217    Clinical Impression Statement  Pt progressing with HEP and ROM improves w/ repetition. Pt does demo mild to moderate edema Rt hand today and mild redness at incision site.    Occupational performance deficits (Please refer to evaluation for details):  ADL's;IADL's    Body Structure / Function / Physical Skills  ROM;IADL;Edema;Scar mobility;Sensation;Strength;Coordination;Pain;UE functional use    Rehab Potential  Good    OT Frequency  1x / week   however pt only wishes to come every other week due to self pay   OT Duration  8 weeks    OT Treatment/Interventions  Self-care/ADL training;Therapeutic exercise;Ultrasound;Manual Therapy;Splinting;Therapeutic activities;DME and/or AE instruction;Paraffin;Cryotherapy;Fluidtherapy;Electrical Stimulation;Scar mobilization;Moist Heat;Contrast Bath;Passive range of motion;Patient/family education    Plan  continue A/ROM and self guided P/ROM, will begin more aggressive P/ROM after pt sees MD, continue to monitor edema and issue compression glove prn, review scar massage, if incision area completely closed - can do fluidotherapy       Patient will benefit from skilled therapeutic intervention in order to improve the following deficits and impairments:   Body Structure / Function / Physical Skills: ROM, IADL, Edema, Scar mobility, Sensation, Strength, Coordination, Pain, UE functional use       Visit Diagnosis: Stiffness of right wrist, not elsewhere classified  Pain in right wrist    Problem List There are no active problems to display for this patient.   Carey Bullocks, OTR/L 11/29/2018, 12:22 PM  [facility] [address] [place], [place], [address] Phone: [phone]   Fax:  [phone]  Name: Jodi Horn MRN:  [id number] Date of Birth: [birth year]

## 2018-12-13 ENCOUNTER — Other Ambulatory Visit: Payer: Self-pay

## 2018-12-13 ENCOUNTER — Ambulatory Visit: Payer: Self-pay | Admitting: Occupational Therapy

## 2018-12-13 DIAGNOSIS — M25531 Pain in right wrist: Secondary | ICD-10-CM

## 2018-12-13 DIAGNOSIS — M25631 Stiffness of right wrist, not elsewhere classified: Secondary | ICD-10-CM

## 2018-12-13 NOTE — Therapy (Signed)
[facility] [address] [place], [place], [address] Phone: [phone]   Fax:  [phone]  Occupational Therapy Treatment  Patient Details  Name: Jodi Horn MRN: [id number] Date of Birth: 05-Jun-1955 Referring Provider (OT): Dr. Milly Jakob   Encounter Date: 12/13/2018  OT End of Session - 12/13/18 1025    Visit Number  3    Number of Visits  8    Date for OT Re-Evaluation  01/13/19    Authorization Type  Self pay    OT Start Time  0930    OT Stop Time  1015    OT Time Calculation (min)  45 min    Activity Tolerance  Patient tolerated treatment well    Behavior During Therapy  Anxious       Past Medical History:  Diagnosis Date  . Closed fracture of right distal radius     Past Surgical History:  Procedure Laterality Date  . ABDOMINAL SURGERY     ovarian cyst  . OPEN REDUCTION INTERNAL FIXATION (ORIF) DISTAL RADIAL FRACTURE Right 11/07/2018   Procedure: OPEN REDUCTION INTERNAL FIXATION (ORIF) DISTAL RADIAL FRACTURE;  Surgeon: Milly Jakob, MD;  Location: [facility];  Service: Orthopedics;  Laterality: Right;  MAC VS GENERAL ANESTHESIA  PRE OP BLOCK 90 MIN SURGERY REQUEST TIME    There were no vitals filed for this visit.  Subjective Assessment - 12/13/18 1005    Subjective   The wound won't close and I haven't been wearing my splint much so air will get to it    Pertinent History  ORIF Rt distal radius fx 11/07/18    Currently in Pain?  No/denies       Pt arrives not wearing splint - when asked, she said she hasn't been wearing it so that more air will get to incision area. Therapist instructed pt that she still needed to wear splint b/t ex's especially when up moving around. Pt had been instructed last session to only leave off longer after cleaning hand/forearm to ensure getting completely dry and allow more air flow.  Pt still with one area of incision that is not fully closed  and slightly redder than when last seen. Pt has clear fluid coming from wound. Therapist called MD office and spoke with [facility] Fuss) - she relayed message to MD and they will call patient back for f/u.  Continued to work on A/ROM and self guided P/ROM ex's - pt shown prayer stretch for wrist extension but instructed to do only to tolerance at this time.  Pt overall appeared to have less edema than last week and reports performing edema reduction strategies, but also reports it still swells a lot at night, therefore issued a compression glove to wear at night. Pt instructed in wear and care (and that it can be worn while in splint)                      OT Short Term Goals - 12/13/18 1026      OT SHORT TERM GOAL #1   Title  Independent with HEP    Time  4    Period  Weeks    Status  Achieved      OT SHORT TERM GOAL #2   Title  Improve wrist extension by 10* or greater    Baseline  20*    Time  4    Period  Weeks    Status  New  OT SHORT TERM GOAL #3   Title  Improve forearm sup/pron each by 10*    Baseline  sup = 48*, pron = 65*    Time  4    Period  Weeks    Status  New      OT SHORT TERM GOAL #4   Title  Pt independent with splint wear and care    Time  4    Period  Weeks    Status  On-going        OT Long Term Goals - 11/16/18 1244      OT LONG TERM GOAL #1   Title  Independent with updated strengthening HEP    Time  8    Period  Weeks    Status  New      OT LONG TERM GOAL #2   Title  Pt to demo wrist and forearm motion WFL's to perform ADLS    Time  8    Period  Weeks    Status  New      OT LONG TERM GOAL #3   Title  Pt to return to using RT hand as dominant hand for all BADLS    Time  8    Period  Weeks    Status  New      OT LONG TERM GOAL #4   Title  Pt to demo 30 lbs grip strength or greater Rt hand to open jars/ containers    Time  8    Period  Weeks    Status  New            Plan - 12/13/18 1026    Clinical  Impression Statement  Pt w/ continued mild swelling Rt hand/forearm but better than before. Pt has one open area at incision that is not closing - MD called.    Occupational performance deficits (Please refer to evaluation for details):  ADL's;IADL's    Body Structure / Function / Physical Skills  ROM;IADL;Edema;Scar mobility;Sensation;Strength;Coordination;Pain;UE functional use    Rehab Potential  Good    OT Frequency  1x / week   however pt only wishes to come every other week   OT Duration  8 weeks    OT Treatment/Interventions  Self-care/ADL training;Therapeutic exercise;Ultrasound;Manual Therapy;Splinting;Therapeutic activities;DME and/or AE instruction;Paraffin;Cryotherapy;Fluidtherapy;Electrical Stimulation;Scar mobilization;Moist Heat;Contrast Bath;Passive range of motion;Patient/family education    Plan  if cleared, may begin more aggressive P/ROM next visit (in 2 weeks). If wound has healed - do [place] over incision to help manage scar tissue    Consulted and Agree with Plan of Care  Patient       Patient will benefit from skilled therapeutic intervention in order to improve the following deficits and impairments:   Body Structure / Function / Physical Skills: ROM, IADL, Edema, Scar mobility, Sensation, Strength, Coordination, Pain, UE functional use       Visit Diagnosis: Stiffness of right wrist, not elsewhere classified  Pain in right wrist    Problem List There are no active problems to display for this patient.   Carey Bullocks, OTR/L 12/13/2018, 10:29 AM  [facility] [address] [place], [place], [address] Phone: [phone]   Fax:  [phone]  Name: Jodi Horn MRN: [id number] Date of Birth: 1956/01/11

## 2018-12-29 ENCOUNTER — Ambulatory Visit: Payer: Self-pay | Admitting: Occupational Therapy

## 2019-01-03 ENCOUNTER — Ambulatory Visit: Payer: Self-pay | Admitting: Occupational Therapy

## 2019-01-18 ENCOUNTER — Encounter: Payer: Self-pay | Admitting: Occupational Therapy

## 2019-01-18 ENCOUNTER — Ambulatory Visit: Payer: Self-pay | Attending: Orthopedic Surgery | Admitting: Occupational Therapy

## 2019-01-18 ENCOUNTER — Other Ambulatory Visit: Payer: Self-pay

## 2019-01-18 DIAGNOSIS — M25531 Pain in right wrist: Secondary | ICD-10-CM | POA: Insufficient documentation

## 2019-01-18 DIAGNOSIS — M6281 Muscle weakness (generalized): Secondary | ICD-10-CM | POA: Insufficient documentation

## 2019-01-18 DIAGNOSIS — R278 Other lack of coordination: Secondary | ICD-10-CM | POA: Insufficient documentation

## 2019-01-18 DIAGNOSIS — M25631 Stiffness of right wrist, not elsewhere classified: Secondary | ICD-10-CM | POA: Insufficient documentation

## 2019-01-18 NOTE — Patient Instructions (Signed)
   PROM: Wrist Flexion / Extension   Grasp  hand and slowly bend wrist until stretch is felt. Relax. Then stretch as far as possible in opposite direction. Be sure to keep elbow bent.  Hold __20__ sec. each way Repeat _5___ times per set.    Do _3___ sessions per day.    Wrist Flexion: Resisted   With right palm up, 1 pound weight in hand, bend wrist up. Return slowly. Repeat 10-15 times per set.  Do 2 sessions per day.  Wrist Extension: Resisted   With right palm down, 1 pound weight in hand, bend wrist up. Return slowly. Repeat 10-15 times per set. Do 2 sessions per day.    Radial Deviation (Resistive)    Holding 1lb weight, bend wrist upward, with thumb pointing toward you. Hold 3 seconds. Repeat 10-15 times. Do 2 sessions per day. Activity: Use this movement to pick up a cup.*   Pronation / Supination (Resistive)    Hold hammer weighing  and rotate palm up and down. Keep elbow flexed at side and wrist straight. Repeat 10  times.  Hold 5sec. Do 2 sessions per day.    11. Grip Strengthening (Resistive Putty)   Squeeze putty using thumb and all fingers. Repeat 15 times. Do 2 sessions per day.   Extension (Assistive Putty)   Roll putty back and forth, being sure to use all fingertips. Repeat 3 times. Do 2 sessions per day.  Then pinch as below.   Palmar Pinch Strengthening (Resistive Putty)   Pinch putty between thumb and each fingertip in turn after rolling out   Lateral Pinch Strengthening (Resistive Putty)    Squeeze between thumb and side of each finger in turn. Repeat 10 times. Do 2 sessions per day.

## 2019-01-18 NOTE — Therapy (Signed)
[facility] [address] [place], [place], [address] Phone: [phone]   Fax:  [phone]  Occupational Therapy Treatment  Patient Details  Name: Jodi Horn MRN: [id number] Date of Birth: 10/30/1955 Referring Provider (OT): Dr. Milly Jakob   Encounter Date: 01/18/2019  OT End of Session - 01/18/19 1054    Visit Number  4    Number of Visits  8    Date for OT Re-Evaluation  01/13/19    Authorization Type  Self pay    OT Start Time  0937    OT Stop Time  1030    OT Time Calculation (min)  53 min    Activity Tolerance  Patient tolerated treatment well    Behavior During Therapy  [facility] for tasks assessed/performed       Past Medical History:  Diagnosis Date  . Closed fracture of right distal radius     Past Surgical History:  Procedure Laterality Date  . ABDOMINAL SURGERY     ovarian cyst  . OPEN REDUCTION INTERNAL FIXATION (ORIF) DISTAL RADIAL FRACTURE Right 11/07/2018   Procedure: OPEN REDUCTION INTERNAL FIXATION (ORIF) DISTAL RADIAL FRACTURE;  Surgeon: Milly Jakob, MD;  Location: [facility];  Service: Orthopedics;  Laterality: Right;  MAC VS GENERAL ANESTHESIA  PRE OP BLOCK 90 MIN SURGERY REQUEST TIME    There were no vitals filed for this visit.  Subjective Assessment - 01/18/19 1050    Subjective   Pt reports that she waited to come back to therapy until wound healed due to financial concerns.  Pt reports "It feels better, I'm excited" by end of session    Pertinent History  ORIF Rt distal radius fx 11/07/18    Currently in Pain?  Yes    Pain Score  4     Pain Location  Wrist    Pain Orientation  Right   ulnarly   Pain Descriptors / Indicators  --   pinching   Pain Type  Acute pain    Pain Onset  More than a month ago    Pain Frequency  Intermittent    Aggravating Factors   wrist movement    Pain Relieving Factors  rest, heat, ice         [facility] OT Assessment - 01/18/19  0001      Hand Function   Right Hand Grip (lbs)  34    Left Hand Grip (lbs)  44       Assessed ROM/progress towards goals--see below.  Discussed progress and plan to continue 1x every other week due to financial concerns to progress HEP.  Pt verbalized agreement.    Fludio x 11 min to right hand for pain, stiffness, desensitization with no adverse reactions.  While therapist replaced strapping/stockinette for splint.    Educated pt in use of moist heat at home and recommended prior to exercise and ice after exercise.  Educated pt on positioning of UEs at night.  Pt verbalized understanding.        OT Education - 01/18/19 1051    Education Details  PROM wrist flex/ext (pt has already been perfroming), wrist strengthening HEP, supination/pronation with hammer, red putty HEP--see pt instructions.  Pt instructed to gradually wean from splint and observe pain, cautioned pt against overdoing it.  Educated pt in elevating arm by her side vs. on chest to decr risk of complications (and bring LUE down by her side vs. on chest as she is reporting numbness in  thumb, 2-3rd digits at night).    Person(s) Educated  Patient    Methods  Explanation;Demonstration;Handout    Comprehension  Verbalized understanding;Returned demonstration;Verbal cues required       OT Short Term Goals - 01/18/19 1058      OT SHORT TERM GOAL #1   Title  Independent with HEP    Time  4    Period  Weeks    Status  Achieved      OT SHORT TERM GOAL #2   Title  Improve wrist extension by 10* or greater    Baseline  20*    Time  4    Period  Weeks    Status  Achieved   01/18/19:  40* at beginning of session, 45* by end of session     OT SHORT TERM GOAL #3   Title  Improve forearm sup/pron each by 10*    Baseline  sup = 48*, pron = 65*    Time  4    Period  Weeks    Status  Achieved   01/18/19:  supination 95*, pronation 70* at beginning of session and 88* at end of session     OT SHORT TERM GOAL #4   Title   Pt independent with splint wear and care    Time  4    Period  Weeks    Status  Achieved        OT Long Term Goals - 01/18/19 1059      OT LONG TERM GOAL #1   Title  Independent with updated strengthening HEP    Time  8    Period  Weeks    Status  On-going      OT LONG TERM GOAL #2   Title  Pt to demo wrist and forearm motion WFL's to perform ADLS    Time  8    Period  Weeks    Status  On-going      OT LONG TERM GOAL #3   Title  Pt to return to using RT hand as dominant hand for all BADLS    Time  8    Period  Weeks    Status  On-going   01/18/19:  approx 50% per pt report     OT LONG TERM GOAL #4   Title  Pt to demo 30 lbs grip strength or greater Rt hand to open jars/ containers--upgraded to 45lbs 01/18/19    Time  8    Period  Weeks    Status  Revised   01/18/19:  met initial goal (34lbs) and upgraded           Plan - 01/18/19 1055    Clinical Impression Statement  Pt has made good progress with improved ROM, strength, and RUE functional use.  However, as pt is now 10 wks post-op, recommended pt gradually wean from splint and incr activity, while observing pain.  Also initiated strengthening and pt tolerated strengthening well today.    Occupational performance deficits (Please refer to evaluation for details):  ADL's;IADL's    Body Structure / Function / Physical Skills  ROM;IADL;Edema;Scar mobility;Sensation;Strength;Coordination;Pain;UE functional use    Rehab Potential  Good    OT Frequency  1x / week   however pt only wishes to come every other week   OT Duration  8 weeks    OT Treatment/Interventions  Self-care/ADL training;Therapeutic exercise;Ultrasound;Manual Therapy;Splinting;Therapeutic activities;DME and/or AE instruction;Paraffin;Cryotherapy;Fluidtherapy;Electrical Stimulation;Scar mobilization;Moist Heat;Contrast Bath;Passive range of motion;Patient/family education  Plan  ?Ultrasound for scar tissue prn, Fludio, continue with strengthening as  tolerated    Consulted and Agree with Plan of Care  Patient       Patient will benefit from skilled therapeutic intervention in order to improve the following deficits and impairments:   Body Structure / Function / Physical Skills: ROM, IADL, Edema, Scar mobility, Sensation, Strength, Coordination, Pain, UE functional use       Visit Diagnosis: Stiffness of right wrist, not elsewhere classified  Pain in right wrist  Muscle weakness (generalized)  Other lack of coordination    Problem List There are no active problems to display for this patient.   [facility] 01/18/2019, 1:08 PM  [facility] [address] [place], [place], [address] Phone: [phone]   Fax:  [phone]  Name: Jodi Horn MRN: [id number] Date of Birth: June 07, 1955   Vianne Bulls, OTR/L [facility] [address] [place], [place]  [address] [phone] phone [phone] 01/18/19 1:11 PM

## 2019-02-01 ENCOUNTER — Other Ambulatory Visit: Payer: Self-pay

## 2019-02-01 ENCOUNTER — Ambulatory Visit: Payer: Self-pay | Admitting: Occupational Therapy

## 2019-02-01 DIAGNOSIS — M25631 Stiffness of right wrist, not elsewhere classified: Secondary | ICD-10-CM

## 2019-02-01 DIAGNOSIS — M6281 Muscle weakness (generalized): Secondary | ICD-10-CM

## 2019-02-01 DIAGNOSIS — M25531 Pain in right wrist: Secondary | ICD-10-CM

## 2019-02-01 NOTE — Therapy (Signed)
[facility] [address] [place], [place], [address] Phone: [phone]   Fax:  [phone]  Occupational Therapy Treatment  Patient Details  Name: Jodi Horn MRN: [id number] Date of Birth: [birth year] Referring Provider (OT): Dr. Milly Jakob   Encounter Date: 02/01/2019  OT End of Session - 02/01/19 1011    Visit Number  5    Number of Visits  8    Date for OT Re-Evaluation  02/13/19    Authorization Type  Self pay    OT Start Time  0935    OT Stop Time  1015    OT Time Calculation (min)  40 min    Activity Tolerance  Patient tolerated treatment well    Behavior During Therapy  [facility] for tasks assessed/performed       Past Medical History:  Diagnosis Date  . Closed fracture of right distal radius     Past Surgical History:  Procedure Laterality Date  . ABDOMINAL SURGERY     ovarian cyst  . OPEN REDUCTION INTERNAL FIXATION (ORIF) DISTAL RADIAL FRACTURE Right 11/07/2018   Procedure: OPEN REDUCTION INTERNAL FIXATION (ORIF) DISTAL RADIAL FRACTURE;  Surgeon: Milly Jakob, MD;  Location: [facility];  Service: Orthopedics;  Laterality: Right;  MAC VS GENERAL ANESTHESIA  PRE OP BLOCK 90 MIN SURGERY REQUEST TIME    There were no vitals filed for this visit.  Subjective Assessment - 02/01/19 0957    Subjective   The doctor  still told me to wear my splint when doing heavy yardwork. (Pt now > 12 weeks post-op and pt instructed to d/c wearing splint at night as pt was still wearing splint at night)    Pertinent History  ORIF Rt distal radius fx 11/07/18    Currently in Pain?  No/denies                   OT Treatments/Exercises (OP) - 02/01/19 0001      Exercises   Exercises  Hand      Wrist Exercises   Other wrist exercises  Hammer exercise for weighted stretch and strengthening to forearm in sup/pron x 20 reps.     Other wrist exercises  wrist flexion, extension each x 10  reps, 2 sets with 3 lb weight      Hand Exercises   Other Hand Exercises  Gripper set at level 2 resistance to pick up blocks RT hand for sustained grip strength      Modalities   Modalities  Ultrasound;Fluidotherapy      RUE Fluidotherapy   Number Minutes Fluidotherapy  12 Minutes    RUE Fluidotherapy Location  Hand;Wrist    Comments  to decrease stiffness       Pt encouraged to use Rt hand as dominant hand for all ADLS at this time and only wear splint for heavy yard work tasks, not any other time.  Upgraded putty to green resistance and issued green putty as pt reports red putty too easy. Pt also instructed to increase weight to 3 lbs for wrist strengthening ex's        OT Short Term Goals - 01/18/19 1058      OT SHORT TERM GOAL #1   Title  Independent with HEP    Time  4    Period  Weeks    Status  Achieved      OT SHORT TERM GOAL #2   Title  Improve wrist extension by 10* or greater  Baseline  20*    Time  4    Period  Weeks    Status  Achieved   01/18/19:  40* at beginning of session, 45* by end of session     OT SHORT TERM GOAL #3   Title  Improve forearm sup/pron each by 10*    Baseline  sup = 48*, pron = 65*    Time  4    Period  Weeks    Status  Achieved   01/18/19:  supination 95*, pronation 70* at beginning of session and 88* at end of session     OT SHORT TERM GOAL #4   Title  Pt independent with splint wear and care    Time  4    Period  Weeks    Status  Achieved        OT Long Term Goals - 02/01/19 1013      OT LONG TERM GOAL #1   Title  Independent with updated strengthening HEP    Time  8    Period  Weeks    Status  Achieved      OT LONG TERM GOAL #2   Title  Pt to demo wrist and forearm motion WFL's to perform ADLS    Time  8    Period  Weeks    Status  On-going      OT LONG TERM GOAL #3   Title  Pt to return to using RT hand as dominant hand for all BADLS    Time  8    Period  Weeks    Status  On-going   01/18/19:  approx  50% per pt report     OT LONG TERM GOAL #4   Title  Pt to demo 30 lbs grip strength or greater Rt hand to open jars/ containers--upgraded to 45lbs 01/18/19    Time  8    Period  Weeks    Status  Revised   01/18/19:  met initial goal (34lbs) and upgraded           Plan - 02/01/19 1015    Clinical Impression Statement  Pt continues to make progress with strengthening at wrist and hand. Pt reports red putty now too easy    Occupational performance deficits (Please refer to evaluation for details):  ADL's;IADL's    Body Structure / Function / Physical Skills  ROM;IADL;Edema;Scar mobility;Sensation;Strength;Coordination;Pain;UE functional use    Rehab Potential  Good    OT Frequency  1x / week   however, pt wishes to be seen every other week   OT Duration  8 weeks    OT Treatment/Interventions  Self-care/ADL training;Therapeutic exercise;Ultrasound;Manual Therapy;Splinting;Therapeutic activities;DME and/or AE instruction;Paraffin;Cryotherapy;Fluidtherapy;Electrical Stimulation;Scar mobilization;Moist Heat;Contrast Bath;Passive range of motion;Patient/family education    Plan  check remaining goals, reassess wrist and forearm ROM and grip strength, and consider d/c next session    Consulted and Agree with Plan of Care  Patient       Patient will benefit from skilled therapeutic intervention in order to improve the following deficits and impairments:   Body Structure / Function / Physical Skills: ROM, IADL, Edema, Scar mobility, Sensation, Strength, Coordination, Pain, UE functional use       Visit Diagnosis: Stiffness of right wrist, not elsewhere classified  Pain in right wrist  Muscle weakness (generalized)    Problem List There are no active problems to display for this patient.   Jodi Horn, Jodi Horn 02/01/2019, 11:42 AM  [facility]  [place], [place], [address] Phone: [phone]    Fax:  [phone]  Name: Jodi Horn MRN: [id number] Date of Birth: [birth year]

## 2019-02-15 ENCOUNTER — Ambulatory Visit: Payer: Self-pay | Admitting: Occupational Therapy

## 2019-02-20 ENCOUNTER — Ambulatory Visit: Payer: Self-pay | Admitting: Occupational Therapy

## 2019-03-01 ENCOUNTER — Ambulatory Visit: Payer: Self-pay | Admitting: Occupational Therapy

## 2019-03-06 ENCOUNTER — Ambulatory Visit: Payer: Self-pay | Admitting: Occupational Therapy

## 2019-03-16 NOTE — Therapy (Signed)
[facility] [address] [place], [place], [address] Phone: [phone]   Fax:  [phone]  Patient Details  Name: Jodi Horn MRN: [id number] Date of Birth: 11-11-1955 Referring Provider:  No ref. provider found  Encounter Date: 03/16/2019  OCCUPATIONAL THERAPY DISCHARGE SUMMARY  Visits from Start of Care: 5  Current functional level related to goals / functional outcomes: OT Short Term Goals - 01/18/19 1058      OT SHORT TERM GOAL #1   Title  Independent with HEP    Time  4    Period  Weeks    Status  Achieved      OT SHORT TERM GOAL #2   Title  Improve wrist extension by 10* or greater    Baseline  20*    Time  4    Period  Weeks    Status  Achieved   01/18/19:  40* at beginning of session, 45* by end of session     OT SHORT TERM GOAL #3   Title  Improve forearm sup/pron each by 10*    Baseline  sup = 48*, pron = 65*    Time  4    Period  Weeks    Status  Achieved   01/18/19:  supination 95*, pronation 70* at beginning of session and 88* at end of session     OT SHORT TERM GOAL #4   Title  Pt independent with splint wear and care    Time  4    Period  Weeks    Status  Achieved      OT Long Term Goals - 02/01/19 1013      OT LONG TERM GOAL #1   Title  Independent with updated strengthening HEP    Time  8    Period  Weeks    Status  Achieved      OT LONG TERM GOAL #2   Title  Pt to demo wrist and forearm motion WFL's to perform ADLS    Time  8    Period  Weeks    Status  Achieved     OT LONG TERM GOAL #3   Title  Pt to return to using RT hand as dominant hand for all BADLS    Time  8    Period  Weeks    Status  On-going   01/18/19:  approx 50% per pt report     OT LONG TERM GOAL #4   Title  Pt to demo 30 lbs grip strength or greater Rt hand to open jars/ containers--upgraded to 45lbs 01/18/19    Time  8    Period  Weeks    Status  Revised   01/18/19:  met initial goal (34lbs) and  upgraded        Remaining deficits: Unknown secondary to pt not returning - Pt had met most goals when last seen   Education / Equipment: HEP's  Plan:                                                    Patient goals were met. Patient is being discharged due to not returning since the last visit.  ?????       Carey Bullocks, OTR/L 03/16/2019, 3:21 PM  [facility] [address]  [place], [place], [address] Phone: [phone]   Fax:  [phone]

## 2019-05-02 ENCOUNTER — Other Ambulatory Visit: Payer: Self-pay | Admitting: Orthopedic Surgery

## 2019-05-15 ENCOUNTER — Other Ambulatory Visit: Payer: Self-pay

## 2019-05-15 ENCOUNTER — Encounter (HOSPITAL_BASED_OUTPATIENT_CLINIC_OR_DEPARTMENT_OTHER): Payer: Self-pay | Admitting: Orthopedic Surgery

## 2019-05-18 ENCOUNTER — Other Ambulatory Visit (HOSPITAL_COMMUNITY)
Admission: RE | Admit: 2019-05-18 | Discharge: 2019-05-18 | Disposition: A | Discharge status: Home / Self Care | Payer: HRSA Program | Source: Ambulatory Visit | Attending: Orthopedic Surgery | Admitting: Orthopedic Surgery

## 2019-05-18 DIAGNOSIS — Z01812 Encounter for preprocedural laboratory examination: Secondary | ICD-10-CM | POA: Insufficient documentation

## 2019-05-18 DIAGNOSIS — Z20822 Contact with and (suspected) exposure to covid-19: Secondary | ICD-10-CM | POA: Insufficient documentation

## 2019-05-18 LAB — SARS CORONAVIRUS 2 (TAT 6-24 HRS): SARS Coronavirus 2: NEGATIVE

## 2019-05-20 NOTE — H&P (Signed)
Jodi Horn is an 64 y.o. female.   CC / Reason for Visit: Right wrist reevaluation; left hand numbness and tingling; right hand numbness and tingling HPI: This patient returns to clinic today and indicates that despite conservative measures including use of NSAIDs, nighttime splinting, and injection she has had no significant changes to the numbness and tingling that she is experiencing bilaterally.  She would like to move forward with nerve conduction studies and then potentially surgery.    HPI 03/30/2019:This patient returns to clinic today for reevaluation of bilateral hand numbness and tingling.  She indicates that the burning sensation that she was experiencing in the palmar aspect of her right hand is significantly better than it was, but it does still feel as though she has a thick, neoprene glove covering her digits in it is difficult to pick up small items with her right.  She states that her left still continues to have numbness and tingling as well but her symptoms are less.  We are reminded that the symptoms began on 01/19/2019 in the left hand and improved after injection.  She received the injection into her right hand on 02/16/2019.  She is nighttime splinting.  Past Medical History:  Diagnosis Date  . Closed fracture of right distal radius     Past Surgical History:  Procedure Laterality Date  . ABDOMINAL SURGERY     ovarian cyst  . OPEN REDUCTION INTERNAL FIXATION (ORIF) DISTAL RADIAL FRACTURE Right 11/07/2018   Procedure: OPEN REDUCTION INTERNAL FIXATION (ORIF) DISTAL RADIAL FRACTURE;  Surgeon: Milly Jakob, MD;  Location: [facility];  Service: Orthopedics;  Laterality: Right;  MAC VS GENERAL ANESTHESIA  PRE OP BLOCK 90 MIN SURGERY REQUEST TIME    History reviewed. No pertinent family history. Social History:  reports that she has never smoked. She has never used smokeless tobacco. She reports that she does not drink alcohol or use drugs.  Allergies: No  Known Allergies  No medications prior to admission.    Results for orders placed or performed during the hospital encounter of 05/18/19 (from the past 48 hour(s))  SARS CORONAVIRUS 2 (TAT 6-24 HRS) Nasopharyngeal Nasopharyngeal Swab     Status: None   Collection Time: 05/18/19 10:38 AM   Specimen: Nasopharyngeal Swab  Result Value Ref Range   SARS Coronavirus 2 NEGATIVE NEGATIVE    Comment: (NOTE) SARS-CoV-2 target nucleic acids are NOT DETECTED. The SARS-CoV-2 RNA is generally detectable in upper and lower respiratory specimens during the acute phase of infection. Negative results do not preclude SARS-CoV-2 infection, do not rule out co-infections with other pathogens, and should not be used as the sole basis for treatment or other patient management decisions. Negative results must be combined with clinical observations, patient history, and epidemiological information. The expected result is Negative. Fact Sheet for Patients: SugarRoll.be Fact Sheet for Healthcare Providers: https://www.woods-mathews.com/ This test is not yet approved or cleared by the [place] FDA and  has been authorized for detection and/or diagnosis of SARS-CoV-2 by FDA under an Emergency Use Authorization (EUA). This EUA will remain  in effect (meaning this test can be used) for the duration of the COVID-19 declaration under Section 56 4(b)(1) of the Act, 21 U.S.C. section 360bbb-3(b)(1), unless the authorization is terminated or revoked sooner. Performed at [facility], [place] [address], [place], [place] [address]    No results found.  Review of Systems  All other systems reviewed and are negative.   Height _0  (1.702 m),  weight 59 kg. Physical Exam  Constitutional:  WD, WN, NAD HEENT:  NCAT, EOMI Neuro/Psych:  Alert & oriented to person, place, and time; appropriate mood & affect Lymphatic: No generalized UE edema or  lymphadenopathy Extremities / MSK:  Both UE are normal with respect to appearance, ranges of motion, joint stability, muscle strength/tone, sensation, & perfusion except as otherwise noted:  Right hand has essentially baseline digital motion.  Light touch remains altered into median nerve distribution bilaterally. Positive Tinel's over the L CT.  Monofilament testing again today: Right thumb 4.31, index finger improved to 3.61, long finger 3.61, ring finger 3.61R/2.83 U, small finger 2.83.  Left monofilaments all digits all sides 2.83  The left hand has full digital motion.  Light touch still altered subjectively in median nerve distribution.   Labs / Xrays:  No new X-rays. 4 views of the right wrist ordered and obtained previously reveals intact volar plate and screw fixation of near anatomically aligned distal radius fracture with some early healing and no lucency about the screws.  Hardware appears to be extra-articular  Assessment:  1.  3+ months postop, doing well 2.  Probable left carpal tunnel syndrome improved with injection 01/19/2019 3.  Probable right carpal tunnel syndrome, caused or at least exacerbated by her right distal radius fracture and subsequent fixation-with some objective improvements after injection 02/16/2019  Plan: The findings are discussed with the patient.  We have scheduled her for nerve conduction studies that will be on March 3 at 130 for bilateral evaluation.  We then will tentatively schedule her at [facility] on March 8 for right sided, 2 incision technique, endoscopic carpal tunnel release.  She will be contacted by our surgery coordinator for arrangements.The details of the operative procedure were discussed with the patient.  Questions were invited and answered.  In addition to the goal of the procedure, the risks of the procedure to include but not limited to bleeding; infection; damage to the nerves or blood vessels that could result in bleeding, numbness,  weakness, chronic pain, and the need for additional procedures; stiffness; the need for revision surgery; and anesthetic risks were reviewed.  No specific outcome was guaranteed or implied.   [name], MD 05/20/2019, 10:07 AM

## 2019-05-21 NOTE — Anesthesia Preprocedure Evaluation (Addendum)
Anesthesia Evaluation  Patient identified by MRN, date of birth, ID band Patient awake    Reviewed: Allergy & Precautions, NPO status   Airway Mallampati: II  TM Distance: >3 FB     Dental   Pulmonary    breath sounds clear to auscultation       Cardiovascular negative cardio ROS   Rhythm:Regular Rate:Normal     Neuro/Psych    GI/Hepatic negative GI ROS, Neg liver ROS,   Endo/Other  negative endocrine ROS  Renal/GU negative Renal ROS     Musculoskeletal   Abdominal   Peds  Hematology   Anesthesia Other Findings   Reproductive/Obstetrics                             Anesthesia Physical Anesthesia Plan  ASA: III  Anesthesia Plan: MAC   Post-op Pain Management:    Induction: Intravenous  PONV Risk Score and Plan: Ondansetron, Dexamethasone and Midazolam  Airway Management Planned: Nasal Cannula  Additional Equipment:   Intra-op Plan:   Post-operative Plan:   Informed Consent: I have reviewed the patients History and Physical, chart, labs and discussed the procedure including the risks, benefits and alternatives for the proposed anesthesia with the patient or authorized representative who has indicated his/her understanding and acceptance.     Dental advisory given  Plan Discussed with: Anesthesiologist and CRNA  Anesthesia Plan Comments:        Anesthesia Quick Evaluation

## 2019-05-22 ENCOUNTER — Encounter (HOSPITAL_BASED_OUTPATIENT_CLINIC_OR_DEPARTMENT_OTHER)
Admission: RE | Disposition: A | Discharge status: Home / Self Care | Payer: Self-pay | Source: Home / Self Care | Attending: Orthopedic Surgery

## 2019-05-22 ENCOUNTER — Ambulatory Visit (HOSPITAL_BASED_OUTPATIENT_CLINIC_OR_DEPARTMENT_OTHER)
Admission: RE | Admit: 2019-05-22 | Discharge: 2019-05-22 | Disposition: A | Discharge status: Home / Self Care | Payer: Self-pay | Attending: Orthopedic Surgery | Admitting: Orthopedic Surgery

## 2019-05-22 ENCOUNTER — Encounter (HOSPITAL_BASED_OUTPATIENT_CLINIC_OR_DEPARTMENT_OTHER): Payer: Self-pay | Admitting: Orthopedic Surgery

## 2019-05-22 ENCOUNTER — Ambulatory Visit (HOSPITAL_BASED_OUTPATIENT_CLINIC_OR_DEPARTMENT_OTHER): Payer: Self-pay | Admitting: Anesthesiology

## 2019-05-22 ENCOUNTER — Other Ambulatory Visit: Payer: Self-pay

## 2019-05-22 DIAGNOSIS — G5601 Carpal tunnel syndrome, right upper limb: Secondary | ICD-10-CM | POA: Insufficient documentation

## 2019-05-22 HISTORY — PX: CARPAL TUNNEL RELEASE: SHX101

## 2019-05-22 SURGERY — RELEASE, CARPAL TUNNEL, ENDOSCOPIC
Anesthesia: Monitor Anesthesia Care | Site: Wrist | Laterality: Right

## 2019-05-22 MED ORDER — LIDOCAINE HCL 2 % IJ SOLN
INTRAMUSCULAR | Status: AC
  Filled 2019-05-22: qty 20

## 2019-05-22 MED ORDER — CHLORHEXIDINE GLUCONATE 4 % EX LIQD: 60.0000 mL | Freq: Once | CUTANEOUS | Status: DC

## 2019-05-22 MED ORDER — LIDOCAINE-EPINEPHRINE 1 %-1:100000 IJ SOLN
INTRAMUSCULAR | Status: AC
  Filled 2019-05-22: qty 1

## 2019-05-22 MED ORDER — PROPOFOL 500 MG/50ML IV EMUL
INTRAVENOUS | Status: DC | PRN
  Administered 2019-05-22: 75 ug/kg/min via INTRAVENOUS

## 2019-05-22 MED ORDER — LIDOCAINE 2% (20 MG/ML) 5 ML SYRINGE
INTRAMUSCULAR | Status: AC
  Filled 2019-05-22: qty 5

## 2019-05-22 MED ORDER — MIDAZOLAM HCL 2 MG/2ML IJ SOLN: 1.0000 mg | INTRAMUSCULAR | Status: DC | PRN

## 2019-05-22 MED ORDER — EPINEPHRINE PF 1 MG/ML IJ SOLN
INTRAMUSCULAR | Status: AC
  Filled 2019-05-22: qty 1

## 2019-05-22 MED ORDER — LIDOCAINE-EPINEPHRINE 1 %-1:100000 IJ SOLN
INTRAMUSCULAR | Status: DC | PRN
  Administered 2019-05-22: 3 mL

## 2019-05-22 MED ORDER — PROPOFOL 10 MG/ML IV BOLUS
INTRAVENOUS | Status: AC
  Filled 2019-05-22: qty 20

## 2019-05-22 MED ORDER — ONDANSETRON HCL 4 MG/2ML IJ SOLN
INTRAMUSCULAR | Status: AC
  Filled 2019-05-22: qty 2

## 2019-05-22 MED ORDER — CEFAZOLIN SODIUM-DEXTROSE 2-4 GM/100ML-% IV SOLN
2.0000 g | INTRAVENOUS | Status: AC
  Administered 2019-05-22: 2 g via INTRAVENOUS

## 2019-05-22 MED ORDER — PROPOFOL 10 MG/ML IV BOLUS
INTRAVENOUS | Status: DC | PRN
  Administered 2019-05-22: 10 mg via INTRAVENOUS
  Administered 2019-05-22 (×2): 20 mg via INTRAVENOUS
  Administered 2019-05-22: 40 mg via INTRAVENOUS
  Administered 2019-05-22: 10 mg via INTRAVENOUS

## 2019-05-22 MED ORDER — FENTANYL CITRATE (PF) 100 MCG/2ML IJ SOLN: 50.0000 ug | INTRAMUSCULAR | Status: DC | PRN

## 2019-05-22 MED ORDER — BUPIVACAINE HCL (PF) 0.5 % IJ SOLN
INTRAMUSCULAR | Status: DC | PRN
  Administered 2019-05-22: 3 mL

## 2019-05-22 MED ORDER — FENTANYL CITRATE (PF) 100 MCG/2ML IJ SOLN
INTRAMUSCULAR | Status: AC
  Filled 2019-05-22: qty 2

## 2019-05-22 MED ORDER — BUPIVACAINE HCL (PF) 0.5 % IJ SOLN
INTRAMUSCULAR | Status: AC
  Filled 2019-05-22: qty 30

## 2019-05-22 MED ORDER — MIDAZOLAM HCL 2 MG/2ML IJ SOLN
INTRAMUSCULAR | Status: DC | PRN
  Administered 2019-05-22: 1 mg via INTRAVENOUS

## 2019-05-22 MED ORDER — LIDOCAINE HCL (PF) 0.5 % IJ SOLN
INTRAMUSCULAR | Status: AC
  Filled 2019-05-22: qty 50

## 2019-05-22 MED ORDER — LIDOCAINE HCL (CARDIAC) PF 100 MG/5ML IV SOSY
PREFILLED_SYRINGE | INTRAVENOUS | Status: DC | PRN
  Administered 2019-05-22: 40 mg via INTRAVENOUS

## 2019-05-22 MED ORDER — OXYCODONE HCL 5 MG PO TABS: 5.0000 mg | ORAL_TABLET | Freq: Four times a day (QID) | ORAL | 0 refills | Status: DC | PRN

## 2019-05-22 MED ORDER — LACTATED RINGERS IV SOLN: INTRAVENOUS | Status: DC

## 2019-05-22 MED ORDER — MIDAZOLAM HCL 2 MG/2ML IJ SOLN
INTRAMUSCULAR | Status: AC
  Filled 2019-05-22: qty 2

## 2019-05-22 MED ORDER — FENTANYL CITRATE (PF) 100 MCG/2ML IJ SOLN: 25.0000 ug | INTRAMUSCULAR | Status: DC | PRN

## 2019-05-22 MED ORDER — ONDANSETRON HCL 4 MG/2ML IJ SOLN
INTRAMUSCULAR | Status: DC | PRN
  Administered 2019-05-22: 4 mg via INTRAVENOUS

## 2019-05-22 MED ORDER — PROPOFOL 500 MG/50ML IV EMUL
INTRAVENOUS | Status: AC
  Filled 2019-05-22: qty 50

## 2019-05-22 MED ORDER — CEFAZOLIN SODIUM-DEXTROSE 2-4 GM/100ML-% IV SOLN
INTRAVENOUS | Status: AC
  Filled 2019-05-22: qty 100

## 2019-05-22 MED ORDER — IBUPROFEN 200 MG PO TABS: 600.0000 mg | ORAL_TABLET | Freq: Four times a day (QID) | ORAL | Status: AC

## 2019-05-22 MED ORDER — ACETAMINOPHEN 325 MG PO TABS: 650.0000 mg | ORAL_TABLET | Freq: Four times a day (QID) | ORAL | Status: AC

## 2019-05-22 MED ORDER — FENTANYL CITRATE (PF) 100 MCG/2ML IJ SOLN
INTRAMUSCULAR | Status: DC | PRN
  Administered 2019-05-22: 25 ug via INTRAVENOUS
  Administered 2019-05-22: 50 ug via INTRAVENOUS

## 2019-05-22 SURGICAL SUPPLY — 45 items
APL PRP STRL LF DISP 70% ISPRP (MISCELLANEOUS) ×1
APL SWBSTK 6 STRL LF DISP (MISCELLANEOUS) ×1
APPLICATOR COTTON TIP 6 STRL (MISCELLANEOUS) ×1 IMPLANT
APPLICATOR COTTON TIP 6IN STRL (MISCELLANEOUS) ×3
BLADE HOOK ENDO STRL (BLADE) ×3 IMPLANT
BLADE SURG 15 STRL LF DISP TIS (BLADE) ×1 IMPLANT
BLADE SURG 15 STRL SS (BLADE) ×3
BLADE TRIANGLE EPF/EGR ENDO (BLADE) ×3 IMPLANT
BNDG CMPR 9X4 STRL LF SNTH (GAUZE/BANDAGES/DRESSINGS) ×1
BNDG COHESIVE 4X5 TAN STRL (GAUZE/BANDAGES/DRESSINGS) ×3 IMPLANT
BNDG ESMARK 4X9 LF (GAUZE/BANDAGES/DRESSINGS) ×3 IMPLANT
BNDG GAUZE ELAST 4 BULKY (GAUZE/BANDAGES/DRESSINGS) ×3 IMPLANT
CHLORAPREP W/TINT 26 (MISCELLANEOUS) ×3 IMPLANT
CORD BIPOLAR FORCEPS 12FT (ELECTRODE) IMPLANT
COVER BACK TABLE 60X90IN (DRAPES) ×3 IMPLANT
COVER MAYO STAND STRL (DRAPES) ×3 IMPLANT
COVER WAND RF STERILE (DRAPES) IMPLANT
CUFF TOURN SGL QUICK 18X4 (TOURNIQUET CUFF) ×2 IMPLANT
DRAIN TLS ROUND 10FR (DRAIN) IMPLANT
DRAPE EXTREMITY T 121X128X90 (DISPOSABLE) ×3 IMPLANT
DRAPE SURG 17X23 STRL (DRAPES) ×3 IMPLANT
DRSG EMULSION OIL 3X3 NADH (GAUZE/BANDAGES/DRESSINGS) ×3 IMPLANT
GAUZE SPONGE 4X4 12PLY STRL LF (GAUZE/BANDAGES/DRESSINGS) ×3 IMPLANT
GLOVE BIO SURGEON STRL SZ7.5 (GLOVE) ×3 IMPLANT
GLOVE BIOGEL PI IND STRL 7.0 (GLOVE) ×1 IMPLANT
GLOVE BIOGEL PI IND STRL 8 (GLOVE) ×1 IMPLANT
GLOVE BIOGEL PI INDICATOR 7.0 (GLOVE) ×6
GLOVE BIOGEL PI INDICATOR 8 (GLOVE) ×2
GLOVE ECLIPSE 6.5 STRL STRAW (GLOVE) ×5 IMPLANT
GOWN STRL REUS W/ TWL LRG LVL3 (GOWN DISPOSABLE) ×2 IMPLANT
GOWN STRL REUS W/TWL LRG LVL3 (GOWN DISPOSABLE) ×6
GOWN STRL REUS W/TWL XL LVL3 (GOWN DISPOSABLE) ×3 IMPLANT
NDL HYPO 25X1 1.5 SAFETY (NEEDLE) IMPLANT
NEEDLE HYPO 25X1 1.5 SAFETY (NEEDLE) IMPLANT
NS IRRIG 1000ML POUR BTL (IV SOLUTION) ×3 IMPLANT
PACK BASIN DAY SURGERY FS (CUSTOM PROCEDURE TRAY) ×3 IMPLANT
PADDING CAST ABS 4INX4YD NS (CAST SUPPLIES) ×2
PADDING CAST ABS COTTON 4X4 ST (CAST SUPPLIES) IMPLANT
STOCKINETTE 6  STRL (DRAPES) ×3
STOCKINETTE 6 STRL (DRAPES) ×1 IMPLANT
SUT VICRYL RAPIDE 4-0 (SUTURE) ×3 IMPLANT
SYR 10ML LL (SYRINGE) IMPLANT
SYR BULB 3OZ (MISCELLANEOUS) ×3 IMPLANT
TOWEL GREEN STERILE FF (TOWEL DISPOSABLE) ×6 IMPLANT
UNDERPAD 30X36 HEAVY ABSORB (UNDERPADS AND DIAPERS) ×1 IMPLANT

## 2019-05-22 NOTE — Discharge Instructions (Signed)
Discharge Instructions   You have a light dressing on your hand.  You may begin gentle motion of your fingers and hand immediately, but you should not do any heavy lifting or gripping.  Elevate your hand to reduce pain & swelling of the digits.  Ice over the operative site may be helpful to reduce pain & swelling.  DO NOT USE HEAT. Pain medicine has been prescribed for you.  Take Tylenol 650 mg and Ibuprofen 600 mg every 6 hours together. Take the Oxycodone additionally for severe post operative pain.  Leave the dressing in place until the third day after your surgery and then remove it, leaving it open to air.  After the bandage has been removed you may shower, regularly washing the incision and letting the water run over it, but not submerging it (no swimming, soaking it in dishwater, etc.) You may drive a car when you are off of prescription pain medications and can safely control your vehicle with both hands. We will address whether therapy will be required or not when you return to the office. You may have already made your follow-up appointment when we completed your preop visit.  If not, please call our office today or the next business day to make your return appointment for 10-15 days after surgery.   Please call [phone] during normal business hours or [phone] after hours for any problems. Including the following:  - excessive redness of the incisions - drainage for more than 4 days - fever of more than 101.5 F  *Please note that pain medications will not be refilled after hours or on weekends.  WORK STATUS: You may use your hand as tolerated without restrictions. It may be sore in the palmar aspect and in the forearm.   Post Anesthesia Home Care Instructions  Activity: Get plenty of rest for the remainder of the day. A responsible individual must stay with you for 24 hours following the procedure.  For the next 24 hours, DO NOT: -Drive a car -Social worker -Drink alcoholic beverages -Take any medication unless instructed by your physician -Make any legal decisions or sign important papers.  Meals: Start with liquid foods such as gelatin or soup. Progress to regular foods as tolerated. Avoid greasy, spicy, heavy foods. If nausea and/or vomiting occur, drink only clear liquids until the nausea and/or vomiting subsides. Call your physician if vomiting continues.  Special Instructions/Symptoms: Your throat may feel dry or sore from the anesthesia or the breathing tube placed in your throat during surgery. If this causes discomfort, gargle with warm salt water. The discomfort should disappear within 24 hours.  If you had a scopolamine patch placed behind your ear for the management of post- operative nausea and/or vomiting:  1. The medication in the patch is effective for 72 hours, after which it should be removed.  Wrap patch in a tissue and discard in the trash. Wash hands thoroughly with soap and water. 2. You may remove the patch earlier than 72 hours if you experience unpleasant side effects which may include dry mouth, dizziness or visual disturbances. 3. Avoid touching the patch. Wash your hands with soap and water after contact with the patch.

## 2019-05-22 NOTE — Transfer of Care (Signed)
Immediate Anesthesia Transfer of Care Note  Patient: Jodi Horn  Procedure(s) Performed: CARPAL TUNNEL RELEASE ENDOSCOPIC (Right Wrist)  Patient Location: PACU  Anesthesia Type:MAC  Level of Consciousness: awake, alert , oriented and patient cooperative  Airway & Oxygen Therapy: Patient Spontanous Breathing and Patient connected to face mask oxygen  Post-op Assessment: Report given to RN and Post -op Vital signs reviewed and stable  Post vital signs: Reviewed and stable  Last Vitals:  Vitals Value Taken Time  BP 110/71 05/22/19 0756  Temp    Pulse 75 05/22/19 0757  Resp 19 05/22/19 0757  SpO2 100 % 05/22/19 0757  Vitals shown include unvalidated device data.  Last Pain:  Vitals:   05/22/19 0630  TempSrc: Tympanic  PainSc: 0-No pain         Complications: No apparent anesthesia complications

## 2019-05-22 NOTE — Interval H&P Note (Signed)
History and Physical Interval Note:  05/22/2019 7:10 AM  Jodi Horn  has presented today for surgery, with the diagnosis of RIGHT CARPAL TUNNEL G56.01.  The various methods of treatment have been discussed with the patient and family. After consideration of risks, benefits and other options for treatment, the patient has consented to  Procedure(s) with comments: CARPAL TUNNEL RELEASE ENDOSCOPIC (Right) - RIGHT ENDOSCOPIC CARPAL TUNNEL RELEASE  LENGTH OF SURGERY: as a surgical intervention.  The patient's history has been reviewed, patient examined, no change in status, stable for surgery.  I have reviewed the patient's chart and labs.  Questions were answered to the patient's satisfaction.     Jodi Marble

## 2019-05-22 NOTE — Op Note (Signed)
05/22/2019  7:10 AM  PATIENT:  Jodi Horn  64 y.o. female  PRE-OPERATIVE DIAGNOSIS:  RIGHT CARPAL TUNNEL G56.01  POST-OPERATIVE DIAGNOSIS:  Same  PROCEDURE:  Procedure(s): CARPAL TUNNEL RELEASE ENDOSCOPIC, RIGHT, 2-INCISION TECHNIQUE  SURGEON:  Surgeon(s): Milly Jakob, MD  PHYSICIAN ASSISTANT: Morley Kos, OPA-C  ANESTHESIA:  Local / MAC  SPECIMENS:  None  DRAINS:   none  EBL:  less than 50 mL  PREOPERATIVE INDICATIONS:  Jodi Horn is a  64 y.o. female with a diagnosis of [place] G56.01 who failed conservative measures and elected for surgical management.    The risks benefits and alternatives were discussed with the patient preoperatively including but not limited to the risks of infection, bleeding, nerve injury, cardiopulmonary complications, the need for revision surgery, among others, and the patient verbalized understanding and consented to proceed.  OPERATIVE IMPLANTS: None  OPERATIVE FINDINGS: Tight carpal tunnel, acceptably decompressed following release  OPERATIVE PROCEDURE:  After receiving prophylactic antibiotics, the patient was escorted to the operative theatre and placed in a supine position.  A surgical "time-out" was performed during which the planned procedure, proposed operative site, and the correct patient identity were compared to the operative consent and agreement confirmed by the circulating nurse according to current facility policy.  Following application of a tourniquet to the operative extremity, the planned incisions were marked and anesthetized with a mixture of lidocaine & bupivicaine containing epinephrine.  The exposed skin was prepped with Chloraprep and draped in the usual sterile fashion.  The limb was exsanguinated with an Esmarch bandage and the tourniquet inflated to approximately 138mHg higher than systolic BP.   The incisions were made sharply. Subcutaneous tissues were dissected with blunt and spreading  dissection. At the proximal incision, the deep forearm fascia was split in line with the skin incision the distal edge grasped with a hemostat. At the mid palmar incision, the palmar fascia was split in line with the skin incision, revealing the underlying superficial palmar arch which was visualized with loupe assisted magnification. The synovial reflector was then introduced into the proximal incision and passed through the carpal canal, uses to reflect synovium from the deep surface of the transverse carpal ligament. It was removed and replaced with the slotted cannula and blunt obturator, passing from proximal to distal and exiting the distal wound superficial to the superficial palmar arch. The obturator was removed and the camera inserted. Visualization of the ligament was acceptable. The triangle shape blade was then inserted distally, advanced to the midportion of the ligament, and there used to create a perforation in the ligament. This instrument was removed and the hooked nstrument was inserted. It was placed into the perforation in the ligament and withdrawn distally, completing transection of the distal half of the ligament. The camera was then removed and placed into the distal end of the cannula. The hooked instrument was placed into the proximal end and advanced facility to be placed into the apex of the V. which had been formed to the distal hemi-transection of the ligament. It was withdrawn proximally, completing transection of the ligament. The adequacy of the release was judged with the scope and the instruments used as a probe. All of the endoscopic instruments are removed and the adequacy of release was again judged from the proximal incision perspective with direct loupe assisted visualization. In addition, the proximal forearm fascia was split for 2 inches proximal to the proximal incision under direct visualization using a sliding scissor technique. The tourniquet was released,  the wound  copiously irrigated. The skin was then closed with 4-0 Vicryl Rapide interrupted sutures. A light dressing was applied.  DISPOSITION:  The patient will be discharged home today, returning in 10-15 days for re-assessment.

## 2019-05-22 NOTE — Anesthesia Postprocedure Evaluation (Signed)
Anesthesia Post Note  Patient: Jodi Horn  Procedure(s) Performed: CARPAL TUNNEL RELEASE ENDOSCOPIC (Right Wrist)     Patient location during evaluation: PACU Anesthesia Type: MAC Pain management: pain level controlled Vital Signs Assessment: post-procedure vital signs reviewed and stable Cardiovascular status: stable Postop Assessment: no apparent nausea or vomiting Anesthetic complications: no    Last Vitals:  Vitals:   05/22/19 0815 05/22/19 0823  BP: (!) 143/91 123/86  Pulse: 65 63  Resp: (!) 24   Temp:  36.4 C  SpO2: (!) 37% 100%    Last Pain:  Vitals:   05/22/19 0823  TempSrc: Oral  PainSc: 0-No pain                 Deborh Pense

## 2019-05-23 ENCOUNTER — Encounter: Payer: Self-pay | Admitting: *Deleted

## 2019-06-02 ENCOUNTER — Other Ambulatory Visit: Payer: Self-pay | Admitting: Orthopedic Surgery

## 2019-06-06 NOTE — H&P (Signed)
Jodi Horn is an 64 y.o. female.   Chief Complaint: L hand N/T HPI: This patient presents for initial postop evaluation, finding that the right side is already dramatically better this soon after surgery.  She finds her motion to be easier, still with numbness in the median distribution, but with lesser degrees of discomfort.  Now is the left side that tends to wake her up quite frequently at nighttime.  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 Closed fracture of right distal radius     Past Surgical History:  Procedure Laterality Date  . ABDOMINAL SURGERY     ovarian cyst  . CARPAL TUNNEL RELEASE Right 05/22/2019   Procedure: CARPAL TUNNEL RELEASE ENDOSCOPIC;  Surgeon: Milly Jakob, MD;  Location: [facility];  Service: Orthopedics;  Laterality: Right;  RIGHT ENDOSCOPIC CARPAL TUNNEL RELEASE  LENGTH OF SURGERY: 30MIN  . OPEN REDUCTION INTERNAL FIXATION (ORIF) DISTAL RADIAL FRACTURE Right 11/07/2018   Procedure: OPEN REDUCTION INTERNAL FIXATION (ORIF) DISTAL RADIAL FRACTURE;  Surgeon: Milly Jakob, MD;  Location: [facility];  Service: Orthopedics;  Laterality: Right;  MAC VS GENERAL ANESTHESIA  PRE OP BLOCK 90 MIN SURGERY REQUEST TIME    No family history on file. Social History:  reports that she has never smoked. She has never used smokeless tobacco. She reports that she does not drink alcohol or use drugs.  Allergies: No Known Allergies  No medications prior to admission.    No results found for this or any previous visit (from the past 48 hour(s)). No results found.  Review of Systems  All other systems reviewed and are negative.   There were no vitals taken for this visit. Physical Exam  Constitutional:  WD, WN, NAD HEENT:  NCAT, EOMI Neuro/Psych:  Alert & oriented to person, place, and time; appropriate mood & affect Lymphatic: No generalized UE edema or  lymphadenopathy Extremities / MSK:  Both UE are normal with respect to appearance, ranges of motion, joint stability, muscle strength/tone, sensation, & perfusion except as otherwise noted:  On the left side, monofilaments measured 3.61 for the thumb and long finger, 2.83 for the others.  Positive Phalen's and Tinel sign over the median nerve in the distal forearm  Labs / Xrays:  No new X-rays. 4 views of the right wrist ordered and obtained previously reveals intact volar plate and screw fixation of near anatomically aligned distal radius fracture with some early healing and no lucency about the screws.  Hardware appears to be extra-articular  Assessment:  L CTS  Plan: The findings are discussed with the patient.  Plan L ECTR, 2-inc technique, at [facility] on 06-19-19.  She will be contacted by our surgery coordinator for arrangements.The details of the operative procedure were discussed with the patient.  Questions were invited and answered.  In addition to the goal of the procedure, the risks of the procedure to include but not limited to bleeding; infection; damage to the nerves or blood vessels that could result in bleeding, numbness, weakness, chronic pain, and the need for additional procedures; stiffness; the need for revision surgery; and anesthetic risks were reviewed.  No specific outcome was guaranteed or implied.   [name], MD 06/06/2019, 10:49 AM

## 2019-06-08 ENCOUNTER — Other Ambulatory Visit: Payer: Self-pay

## 2019-06-08 ENCOUNTER — Encounter (HOSPITAL_BASED_OUTPATIENT_CLINIC_OR_DEPARTMENT_OTHER): Payer: Self-pay | Admitting: Orthopedic Surgery

## 2019-06-15 ENCOUNTER — Other Ambulatory Visit (HOSPITAL_COMMUNITY)
Admission: RE | Admit: 2019-06-15 | Discharge: 2019-06-15 | Disposition: A | Discharge status: Home / Self Care | Payer: HRSA Program | Source: Ambulatory Visit | Attending: Orthopedic Surgery | Admitting: Orthopedic Surgery

## 2019-06-15 DIAGNOSIS — Z01812 Encounter for preprocedural laboratory examination: Secondary | ICD-10-CM | POA: Diagnosis present

## 2019-06-15 DIAGNOSIS — Z20822 Contact with and (suspected) exposure to covid-19: Secondary | ICD-10-CM | POA: Diagnosis not present

## 2019-06-15 LAB — SARS CORONAVIRUS 2 (TAT 6-24 HRS): SARS Coronavirus 2: NEGATIVE

## 2019-06-19 ENCOUNTER — Ambulatory Visit (HOSPITAL_BASED_OUTPATIENT_CLINIC_OR_DEPARTMENT_OTHER): Payer: Self-pay | Admitting: Certified Registered"

## 2019-06-19 ENCOUNTER — Other Ambulatory Visit: Payer: Self-pay

## 2019-06-19 ENCOUNTER — Encounter (HOSPITAL_BASED_OUTPATIENT_CLINIC_OR_DEPARTMENT_OTHER)
Admission: RE | Disposition: A | Discharge status: Home / Self Care | Payer: Self-pay | Source: Home / Self Care | Attending: Orthopedic Surgery

## 2019-06-19 ENCOUNTER — Encounter (HOSPITAL_BASED_OUTPATIENT_CLINIC_OR_DEPARTMENT_OTHER): Payer: Self-pay | Admitting: Orthopedic Surgery

## 2019-06-19 ENCOUNTER — Ambulatory Visit (HOSPITAL_BASED_OUTPATIENT_CLINIC_OR_DEPARTMENT_OTHER)
Admission: RE | Admit: 2019-06-19 | Discharge: 2019-06-19 | Disposition: A | Discharge status: Home / Self Care | Payer: Self-pay | Attending: Orthopedic Surgery | Admitting: Orthopedic Surgery

## 2019-06-19 DIAGNOSIS — G5602 Carpal tunnel syndrome, left upper limb: Secondary | ICD-10-CM | POA: Insufficient documentation

## 2019-06-19 HISTORY — PX: CARPAL TUNNEL RELEASE: SHX101

## 2019-06-19 SURGERY — RELEASE, CARPAL TUNNEL, ENDOSCOPIC
Anesthesia: Monitor Anesthesia Care | Site: Wrist | Laterality: Left

## 2019-06-19 MED ORDER — PROPOFOL 500 MG/50ML IV EMUL
INTRAVENOUS | Status: AC
  Filled 2019-06-19: qty 100

## 2019-06-19 MED ORDER — FENTANYL CITRATE (PF) 100 MCG/2ML IJ SOLN
INTRAMUSCULAR | Status: AC
  Filled 2019-06-19: qty 2

## 2019-06-19 MED ORDER — BUPIVACAINE HCL (PF) 0.5 % IJ SOLN
INTRAMUSCULAR | Status: AC
  Filled 2019-06-19: qty 30

## 2019-06-19 MED ORDER — LIDOCAINE HCL 2 % IJ SOLN
INTRAMUSCULAR | Status: DC | PRN
  Administered 2019-06-19: 4 mL

## 2019-06-19 MED ORDER — OXYCODONE HCL 5 MG PO TABS: 5.0000 mg | ORAL_TABLET | Freq: Once | ORAL | Status: DC | PRN

## 2019-06-19 MED ORDER — MIDAZOLAM HCL 2 MG/2ML IJ SOLN
INTRAMUSCULAR | Status: AC
  Filled 2019-06-19: qty 2

## 2019-06-19 MED ORDER — PROPOFOL 500 MG/50ML IV EMUL
INTRAVENOUS | Status: DC | PRN
  Administered 2019-06-19: 75 ug/kg/min via INTRAVENOUS

## 2019-06-19 MED ORDER — BUPIVACAINE-EPINEPHRINE (PF) 0.5% -1:200000 IJ SOLN
INTRAMUSCULAR | Status: DC | PRN
  Administered 2019-06-19: 4 mL

## 2019-06-19 MED ORDER — ONDANSETRON HCL 4 MG/2ML IJ SOLN
INTRAMUSCULAR | Status: AC
  Filled 2019-06-19: qty 6

## 2019-06-19 MED ORDER — MIDAZOLAM HCL 5 MG/5ML IJ SOLN
INTRAMUSCULAR | Status: DC | PRN
  Administered 2019-06-19: 1 mg via INTRAVENOUS

## 2019-06-19 MED ORDER — PROMETHAZINE HCL 25 MG/ML IJ SOLN: 6.2500 mg | INTRAMUSCULAR | Status: DC | PRN

## 2019-06-19 MED ORDER — CHLORHEXIDINE GLUCONATE 4 % EX LIQD: 60.0000 mL | Freq: Once | CUTANEOUS | Status: DC

## 2019-06-19 MED ORDER — LIDOCAINE HCL 2 % IJ SOLN
INTRAMUSCULAR | Status: AC
  Filled 2019-06-19: qty 20

## 2019-06-19 MED ORDER — DEXAMETHASONE SODIUM PHOSPHATE 10 MG/ML IJ SOLN
INTRAMUSCULAR | Status: AC
  Filled 2019-06-19: qty 1

## 2019-06-19 MED ORDER — ONDANSETRON HCL 4 MG/2ML IJ SOLN
INTRAMUSCULAR | Status: DC | PRN
  Administered 2019-06-19: 4 mg via INTRAVENOUS

## 2019-06-19 MED ORDER — OXYCODONE HCL 5 MG/5ML PO SOLN: 5.0000 mg | Freq: Once | ORAL | Status: DC | PRN

## 2019-06-19 MED ORDER — LIDOCAINE 2% (20 MG/ML) 5 ML SYRINGE
INTRAMUSCULAR | Status: AC
  Filled 2019-06-19: qty 10

## 2019-06-19 MED ORDER — OXYCODONE HCL 5 MG PO TABS: 5.0000 mg | ORAL_TABLET | Freq: Four times a day (QID) | ORAL | 0 refills | Status: AC | PRN

## 2019-06-19 MED ORDER — LACTATED RINGERS IV SOLN: INTRAVENOUS | Status: DC

## 2019-06-19 MED ORDER — CEFAZOLIN SODIUM-DEXTROSE 2-4 GM/100ML-% IV SOLN: 2.0000 g | INTRAVENOUS | Status: DC

## 2019-06-19 MED ORDER — EPINEPHRINE PF 1 MG/ML IJ SOLN
INTRAMUSCULAR | Status: AC
  Filled 2019-06-19: qty 1

## 2019-06-19 MED ORDER — CEFAZOLIN SODIUM-DEXTROSE 2-4 GM/100ML-% IV SOLN
INTRAVENOUS | Status: AC
  Filled 2019-06-19: qty 100

## 2019-06-19 MED ORDER — HYDROMORPHONE HCL 1 MG/ML IJ SOLN: 0.2500 mg | INTRAMUSCULAR | Status: DC | PRN

## 2019-06-19 MED ORDER — FENTANYL CITRATE (PF) 100 MCG/2ML IJ SOLN
INTRAMUSCULAR | Status: DC | PRN
  Administered 2019-06-19: 50 ug via INTRAVENOUS

## 2019-06-19 MED ORDER — LIDOCAINE HCL (CARDIAC) PF 100 MG/5ML IV SOSY
PREFILLED_SYRINGE | INTRAVENOUS | Status: DC | PRN
  Administered 2019-06-19: 60 mg via INTRAVENOUS

## 2019-06-19 SURGICAL SUPPLY — 45 items
APL PRP STRL LF DISP 70% ISPRP (MISCELLANEOUS) ×1
APL SWBSTK 6 STRL LF DISP (MISCELLANEOUS) ×1
APPLICATOR COTTON TIP 6 STRL (MISCELLANEOUS) ×1 IMPLANT
APPLICATOR COTTON TIP 6IN STRL (MISCELLANEOUS) ×3
BLADE HOOK ENDO STRL (BLADE) ×3 IMPLANT
BLADE SURG 15 STRL LF DISP TIS (BLADE) ×1 IMPLANT
BLADE SURG 15 STRL SS (BLADE) ×3
BLADE TRIANGLE EPF/EGR ENDO (BLADE) ×3 IMPLANT
BNDG CMPR 9X4 STRL LF SNTH (GAUZE/BANDAGES/DRESSINGS) ×1
BNDG COHESIVE 4X5 TAN STRL (GAUZE/BANDAGES/DRESSINGS) ×3 IMPLANT
BNDG ESMARK 4X9 LF (GAUZE/BANDAGES/DRESSINGS) ×3 IMPLANT
BNDG GAUZE ELAST 4 BULKY (GAUZE/BANDAGES/DRESSINGS) ×3 IMPLANT
CHLORAPREP W/TINT 26 (MISCELLANEOUS) ×3 IMPLANT
CORD BIPOLAR FORCEPS 12FT (ELECTRODE) IMPLANT
COVER BACK TABLE 60X90IN (DRAPES) ×3 IMPLANT
COVER MAYO STAND STRL (DRAPES) ×3 IMPLANT
COVER WAND RF STERILE (DRAPES) IMPLANT
CUFF TOURN SGL QUICK 18X4 (TOURNIQUET CUFF) IMPLANT
DRAIN TLS ROUND 10FR (DRAIN) IMPLANT
DRAPE EXTREMITY T 121X128X90 (DISPOSABLE) ×3 IMPLANT
DRAPE SURG 17X23 STRL (DRAPES) ×3 IMPLANT
DRSG EMULSION OIL 3X3 NADH (GAUZE/BANDAGES/DRESSINGS) ×3 IMPLANT
GAUZE SPONGE 4X4 12PLY STRL LF (GAUZE/BANDAGES/DRESSINGS) ×3 IMPLANT
GLOVE BIO SURGEON STRL SZ7.5 (GLOVE) ×3 IMPLANT
GLOVE BIOGEL PI IND STRL 7.0 (GLOVE) ×1 IMPLANT
GLOVE BIOGEL PI IND STRL 8 (GLOVE) ×1 IMPLANT
GLOVE BIOGEL PI INDICATOR 7.0 (GLOVE) ×2
GLOVE BIOGEL PI INDICATOR 8 (GLOVE) ×2
GLOVE ECLIPSE 6.5 STRL STRAW (GLOVE) ×3 IMPLANT
GOWN STRL REUS W/ TWL LRG LVL3 (GOWN DISPOSABLE) ×2 IMPLANT
GOWN STRL REUS W/TWL LRG LVL3 (GOWN DISPOSABLE) ×6
GOWN STRL REUS W/TWL XL LVL3 (GOWN DISPOSABLE) ×3 IMPLANT
NDL HYPO 25X1 1.5 SAFETY (NEEDLE) IMPLANT
NEEDLE HYPO 25X1 1.5 SAFETY (NEEDLE) IMPLANT
NS IRRIG 1000ML POUR BTL (IV SOLUTION) ×3 IMPLANT
PACK BASIN DAY SURGERY FS (CUSTOM PROCEDURE TRAY) ×3 IMPLANT
PADDING CAST ABS 4INX4YD NS (CAST SUPPLIES)
PADDING CAST ABS COTTON 4X4 ST (CAST SUPPLIES) IMPLANT
STOCKINETTE 6  STRL (DRAPES) ×3
STOCKINETTE 6 STRL (DRAPES) ×1 IMPLANT
SUT VICRYL RAPIDE 4-0 (SUTURE) ×3 IMPLANT
SYR 10ML LL (SYRINGE) IMPLANT
SYR BULB 3OZ (MISCELLANEOUS) ×3 IMPLANT
TOWEL GREEN STERILE FF (TOWEL DISPOSABLE) ×6 IMPLANT
UNDERPAD 30X36 HEAVY ABSORB (UNDERPADS AND DIAPERS) ×3 IMPLANT

## 2019-06-19 NOTE — Anesthesia Postprocedure Evaluation (Signed)
Anesthesia Post Note  Patient: Jodi Horn  Procedure(s) Performed: CARPAL TUNNEL RELEASE ENDOSCOPIC (Left Wrist)     Patient location during evaluation: PACU Anesthesia Type: MAC Level of consciousness: awake and alert Pain management: pain level controlled Vital Signs Assessment: post-procedure vital signs reviewed and stable Respiratory status: spontaneous breathing, nonlabored ventilation and respiratory function stable Cardiovascular status: blood pressure returned to baseline and stable Postop Assessment: no apparent nausea or vomiting Anesthetic complications: no    Last Vitals:  Vitals:   06/19/19 0823 06/19/19 0829  BP: 122/81 122/81  Pulse: 80 65  Resp: 14 16  Temp:  36.7 C  SpO2: 99% 100%    Last Pain:  Vitals:   06/19/19 0829  TempSrc: Oral  PainSc: 0-No pain                 Lynda Rainwater

## 2019-06-19 NOTE — Interval H&P Note (Signed)
History and Physical Interval Note:  06/19/2019 7:37 AM  Jodi Horn  has presented today for surgery, with the diagnosis of LEFT CARPAL TUNNEL SYNDROME.  The various methods of treatment have been discussed with the patient and family. After consideration of risks, benefits and other options for treatment, the patient has consented to  Procedure(s) with comments: CARPAL TUNNEL RELEASE ENDOSCOPIC (Left) - PROCEDURE: LEFT ENDOSCOPIC CARPAL TUNNEL RELEASE LENGTH OF SURGERY: 30 MIN as a surgical intervention.  The patient's history has been reviewed, patient examined, no change in status, stable for surgery.  I have reviewed the patient's chart and labs.  Questions were answered to the patient's satisfaction.     Jodi Marble

## 2019-06-19 NOTE — Anesthesia Preprocedure Evaluation (Signed)
Anesthesia Evaluation  Patient identified by MRN, date of birth, ID band Patient awake    Reviewed: Allergy & Precautions, NPO status , Patient's Chart, lab work & pertinent test results  Airway Mallampati: II  TM Distance: >3 FB     Dental   Pulmonary neg pulmonary ROS,    breath sounds clear to auscultation       Cardiovascular negative cardio ROS   Rhythm:Regular Rate:Normal     Neuro/Psych negative neurological ROS  negative psych ROS   GI/Hepatic negative GI ROS, Neg liver ROS,   Endo/Other  negative endocrine ROS  Renal/GU negative Renal ROS  negative genitourinary   Musculoskeletal negative musculoskeletal ROS (+)   Abdominal   Peds negative pediatric ROS (+)  Hematology negative hematology ROS (+)   Anesthesia Other Findings   Reproductive/Obstetrics negative OB ROS                             Anesthesia Physical  Anesthesia Plan  ASA: II  Anesthesia Plan: MAC   Post-op Pain Management:    Induction: Intravenous  PONV Risk Score and Plan: 2 and Ondansetron, Midazolam and Treatment may vary due to age or medical condition  Airway Management Planned: Simple Face Mask  Additional Equipment:   Intra-op Plan:   Post-operative Plan:   Informed Consent: I have reviewed the patients History and Physical, chart, labs and discussed the procedure including the risks, benefits and alternatives for the proposed anesthesia with the patient or authorized representative who has indicated his/her understanding and acceptance.     Dental advisory given  Plan Discussed with: Anesthesiologist and CRNA  Anesthesia Plan Comments:         Anesthesia Quick Evaluation

## 2019-06-19 NOTE — Anesthesia Procedure Notes (Signed)
Procedure Name: MAC Date/Time: 06/19/2019 7:53 AM Performed by: Signe Colt, CRNA Pre-anesthesia Checklist: Patient identified, Emergency Drugs available, Suction available, Patient being monitored and Timeout performed Patient Re-evaluated:Patient Re-evaluated prior to induction Oxygen Delivery Method: Simple face mask

## 2019-06-19 NOTE — Discharge Instructions (Signed)
Discharge Instructions   You have a light dressing on your hand.  You may begin gentle motion of your fingers and hand immediately, but you should not do any heavy lifting or gripping.  Elevate your hand to reduce pain & swelling of the digits.  Ice over the operative site may be helpful to reduce pain & swelling.  DO NOT USE HEAT. Pain medicine has been prescribed for you.  Take Tylenol 650 mg and Ibuprofen 600 mg every 6 hours together. Take Oxycodone 5 mg additionally for severe post operative pain as a rescue medicine. Leave the dressing in place until the third day after your surgery and then remove it, leaving it open to air.  After the bandage has been removed you may shower, regularly washing the incision and letting the water run over it, but not submerging it (no swimming, soaking it in dishwater, etc.) You may drive a car when you are off of prescription pain medications and can safely control your vehicle with both hands. We will address whether therapy will be required or not when you return to the office. You may have already made your follow-up appointment when we completed your preop visit.  If not, please call our office today or the next business day to make your return appointment for 10-15 days after surgery.   Please call [phone] during normal business hours or [phone] after hours for any problems. Including the following:  - excessive redness of the incisions - drainage for more than 4 days - fever of more than 101.5 F  *Please note that pain medications will not be refilled after hours or on weekends.    Post Anesthesia Home Care Instructions  Activity: Get plenty of rest for the remainder of the day. A responsible individual must stay with you for 24 hours following the procedure.  For the next 24 hours, DO NOT: -Drive a car -Advertising copywriter -Drink alcoholic beverages -Take any medication unless instructed by your physician -Make any legal  decisions or sign important papers.  Meals: Start with liquid foods such as gelatin or soup. Progress to regular foods as tolerated. Avoid greasy, spicy, heavy foods. If nausea and/or vomiting occur, drink only clear liquids until the nausea and/or vomiting subsides. Call your physician if vomiting continues.  Special Instructions/Symptoms: Your throat may feel dry or sore from the anesthesia or the breathing tube placed in your throat during surgery. If this causes discomfort, gargle with warm salt water. The discomfort should disappear within 24 hours.  If you had a scopolamine patch placed behind your ear for the management of post- operative nausea and/or vomiting:  1. The medication in the patch is effective for 72 hours, after which it should be removed.  Wrap patch in a tissue and discard in the trash. Wash hands thoroughly with soap and water. 2. You may remove the patch earlier than 72 hours if you experience unpleasant side effects which may include dry mouth, dizziness or visual disturbances. 3. Avoid touching the patch. Wash your hands with soap and water after contact with the patch.

## 2019-06-19 NOTE — Op Note (Signed)
06/19/2019  7:37 AM  PATIENT:  Archie Endo  64 y.o. female  PRE-OPERATIVE DIAGNOSIS:  LEFT CARPAL TUNNEL SYNDROME  POST-OPERATIVE DIAGNOSIS:  Same  PROCEDURE:  Procedure(s): CARPAL TUNNEL RELEASE ENDOSCOPIC  SURGEON:  Surgeon(s): Milly Jakob, MD  PHYSICIAN ASSISTANT: Morley Kos, OPA-C  ANESTHESIA:  Local / MAC  SPECIMENS:  None  DRAINS:   none  EBL:  less than 50 mL  PREOPERATIVE INDICATIONS:  Jailey Booton is a  64 y.o. female with a diagnosis of LEFT CARPAL TUNNEL SYNDROME who failed conservative measures and elected for surgical management.    The risks benefits and alternatives were discussed with the patient preoperatively including but not limited to the risks of infection, bleeding, nerve injury, cardiopulmonary complications, the need for revision surgery, among others, and the patient verbalized understanding and consented to proceed.  OPERATIVE IMPLANTS: None  OPERATIVE FINDINGS: Tight carpal tunnel, acceptably decompressed following release  OPERATIVE PROCEDURE:  After receiving prophylactic antibiotics, the patient was escorted to the operative theatre and placed in a supine position.   A surgical "time-out" was performed during which the planned procedure, proposed operative site, and the correct patient identity were compared to the operative consent and agreement confirmed by the circulating nurse according to current facility policy.  Following application of a tourniquet to the operative extremity, the planned incisions were marked and anesthetized with a mixture of lidocaine & bupivicaine containing epinephrine.  The exposed skin was prepped with Chloraprep and draped in the usual sterile fashion.  The limb was exsanguinated with an Esmarch bandage and the tourniquet inflated to approximately 167mHg higher than systolic BP.   The incisions were made sharply. Subcutaneous tissues were dissected with blunt and spreading dissection. At the proximal  incision, the deep forearm fascia was split in line with the skin incision the distal edge grasped with a hemostat. At the mid palmar incision, the palmar fascia was split in line with the skin incision, revealing the underlying superficial palmar arch which was visualized with loupe assisted magnification. The synovial reflector was then introduced into the proximal incision and passed through the carpal canal, uses to reflect synovium from the deep surface of the transverse carpal ligament. It was removed and replaced with the slotted cannula and blunt obturator, passing from proximal to distal and exiting the distal wound superficial to the superficial palmar arch. The obturator was removed and the camera inserted. Visualization of the ligament was acceptable. The triangle shape blade was then inserted distally, advanced to the midportion of the ligament, and there used to create a perforation in the ligament. This instrument was removed and the hooked nstrument was inserted. It was placed into the perforation in the ligament and withdrawn distally, completing transection of the distal half of the ligament. The camera was then removed and placed into the distal end of the cannula. The hooked instrument was placed into the proximal end and advanced facility to be placed into the apex of the V. which had been formed to the distal hemi-transection of the ligament. It was withdrawn proximally, completing transection of the ligament. The adequacy of the release was judged with the scope and the instruments used as a probe. All of the endoscopic instruments are removed and the adequacy of release was again judged from the proximal incision perspective with direct loupe assisted visualization. In addition, the proximal forearm fascia was split for 2 inches proximal to the proximal incision under direct visualization using a sliding scissor technique. The tourniquet was released, the wound  copiously irrigated.  The skin  was then closed with 4-0 Vicryl Rapide interrupted sutures. A light dressing was applied.  DISPOSITION:  The patient will be discharged home today, returning in 10-15 days for re-assessment.

## 2019-06-19 NOTE — Transfer of Care (Signed)
Immediate Anesthesia Transfer of Care Note  Patient: Jodi Horn  Procedure(s) Performed: CARPAL TUNNEL RELEASE ENDOSCOPIC (Left Wrist)  Patient Location: PACU  Anesthesia Type:MAC  Level of Consciousness: awake, alert , oriented and patient cooperative  Airway & Oxygen Therapy: Patient Spontanous Breathing  Post-op Assessment: Report given to RN and Post -op Vital signs reviewed and stable  Post vital signs: Reviewed and stable  Last Vitals:  Vitals Value Taken Time  BP    Temp    Pulse 75 06/19/19 0808  Resp    SpO2 96 % 06/19/19 0808  Vitals shown include unvalidated device data.  Last Pain:  Vitals:   06/19/19 0633  TempSrc: Tympanic  PainSc: 8       Patients Stated Pain Goal: 5 (31/59/45 [id number])  Complications: No apparent anesthesia complications

## 2019-06-20 ENCOUNTER — Encounter: Payer: Self-pay | Admitting: *Deleted

## 2021-01-19 IMAGING — CR RIGHT WRIST - COMPLETE 3+ VIEW
4 series · 4 of 4 positions shown · non-contrast
Comparison: Right wrist x-rays from same day.

CLINICAL DATA: Right wrist pain after fall.

EXAM:
RIGHT WRIST - COMPLETE 3+ VIEW

[wrist pa]
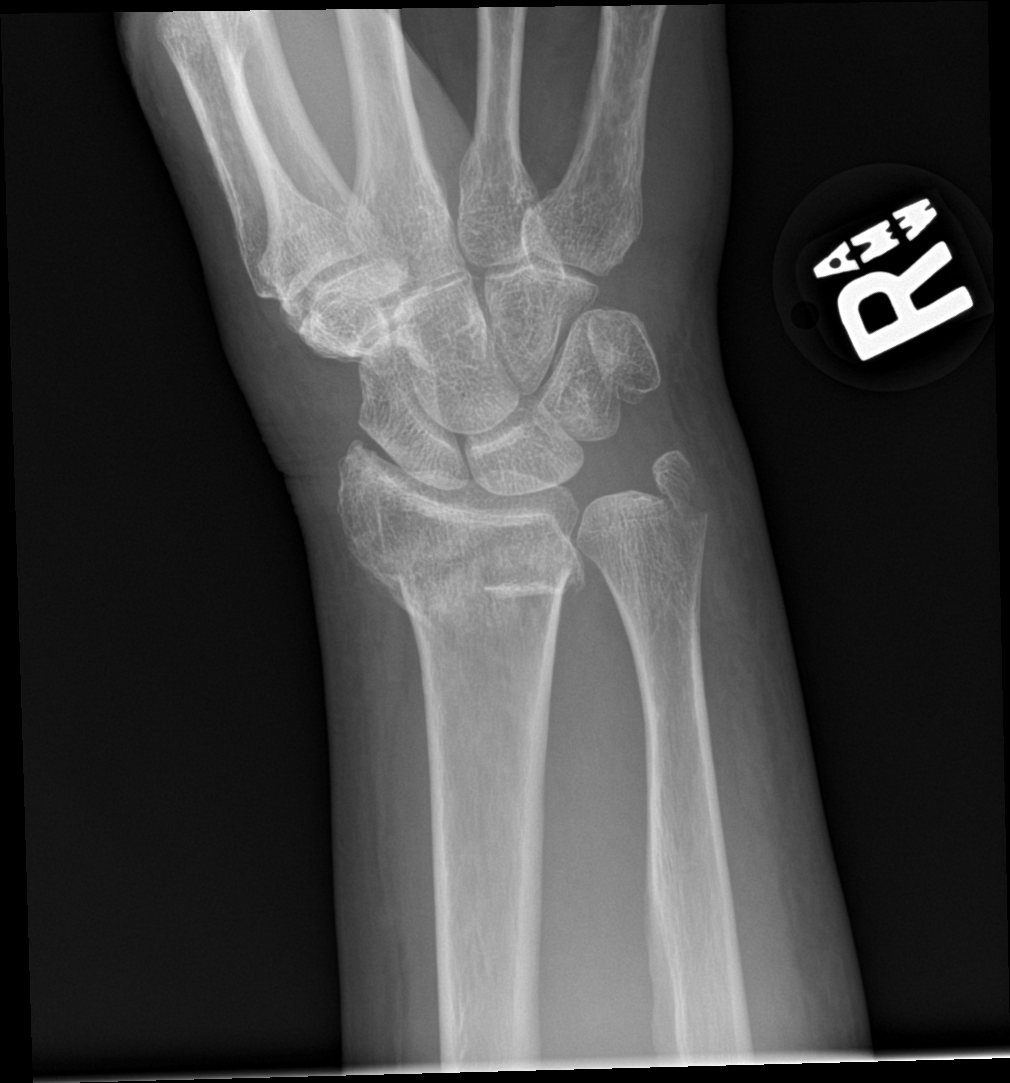

[wrist obl]
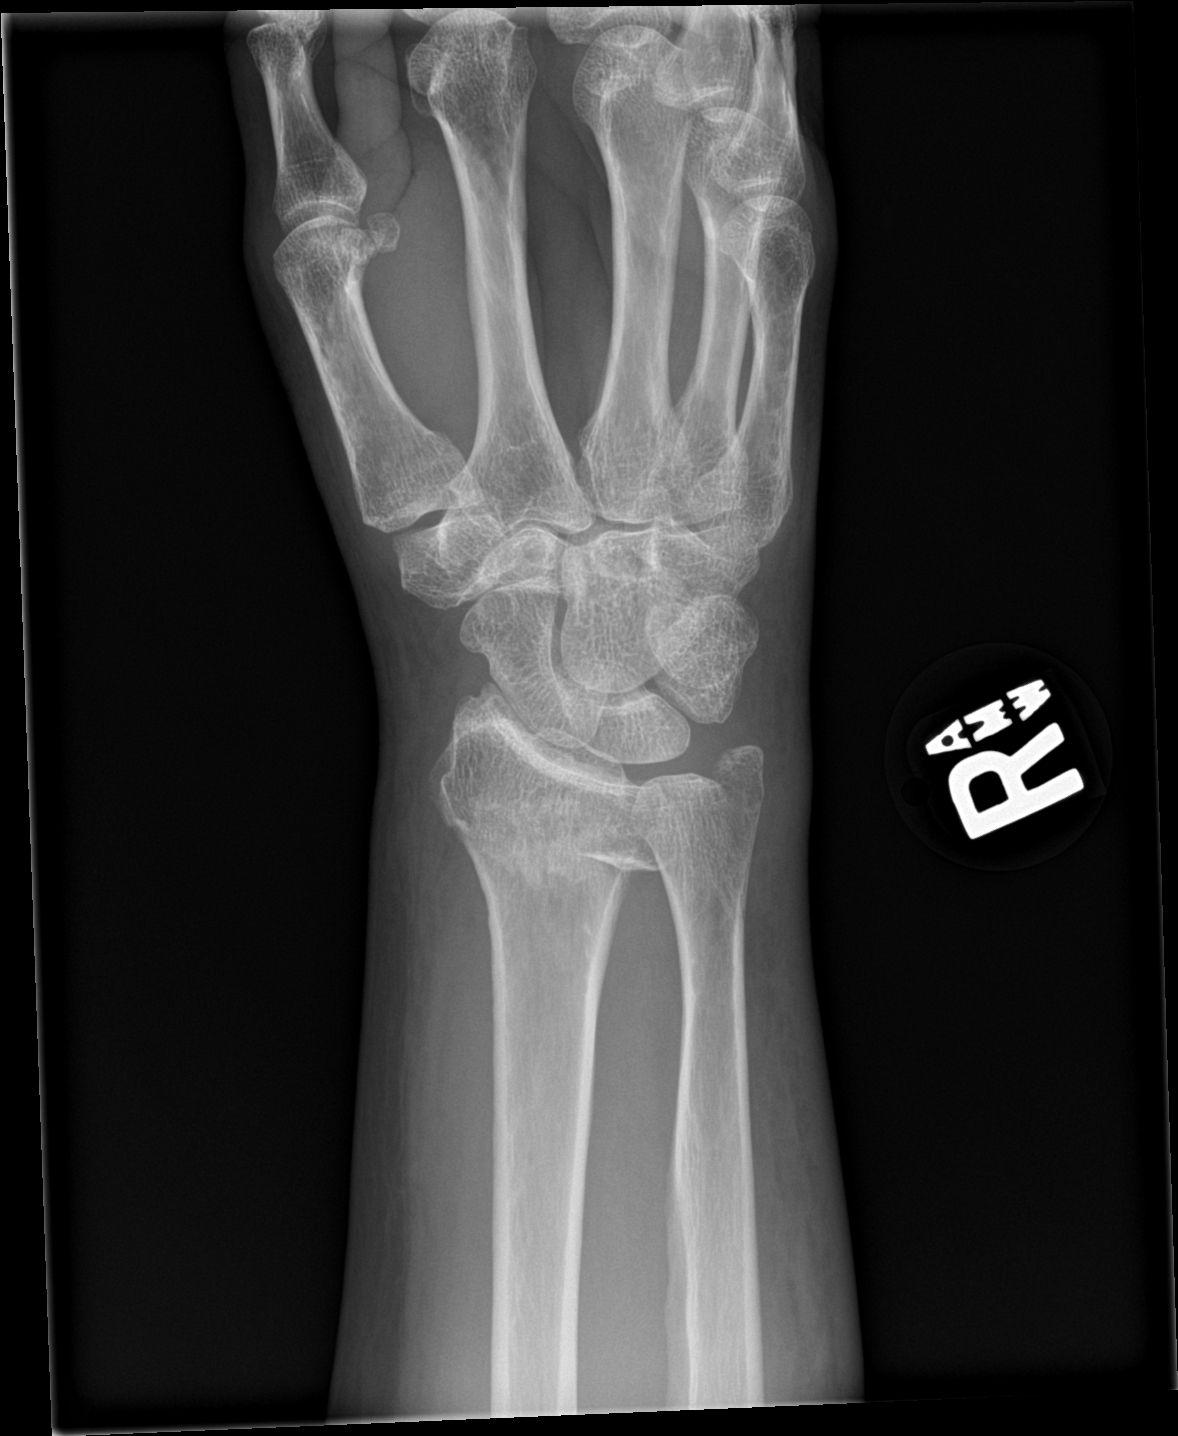

[wrist lat]
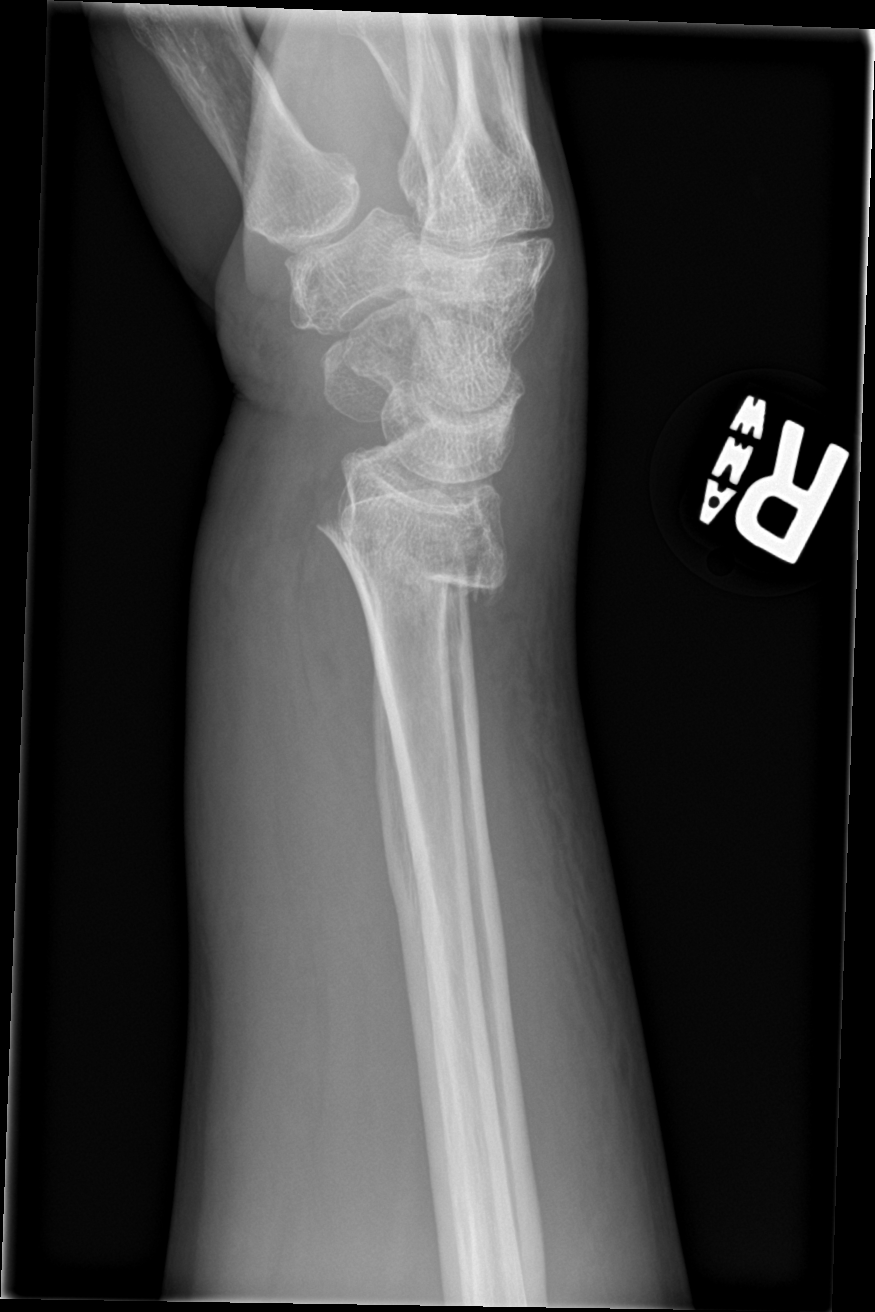

[wrist navicular]
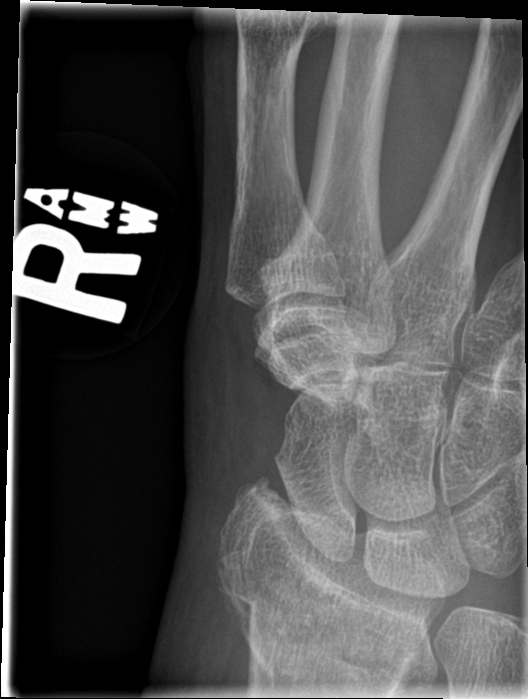

[4 of 4 positions shown; findings below may reference images not displayed]

FINDINGS: Again seen is an acute fracture of the distal radius with mild
dorsal displacement and angulation. No definite intra-articular
extension. Unchanged acute mildly displaced fracture of the ulnar
styloid process. No additional fracture. No dislocation. Joint
spaces are preserved. Bone mineralization is normal. Diffuse soft
tissue swelling about the wrist.
IMPRESSION: 1. Unchanged acute distal radius and ulnar styloid process
fractures.

## 2021-01-29 IMAGING — RF DG C-ARM 1-60 MIN
1 series · 3 of 3 positions shown · non-contrast
Comparison: 10/28/2018.

CLINICAL DATA: ORIF right radius.

EXAM:
RIGHT WRIST - COMPLETE 3+ VIEW; DG C-ARM 1-60 MIN

[Series 1: run · 3 of 3 slices shown]
[im 1/3]
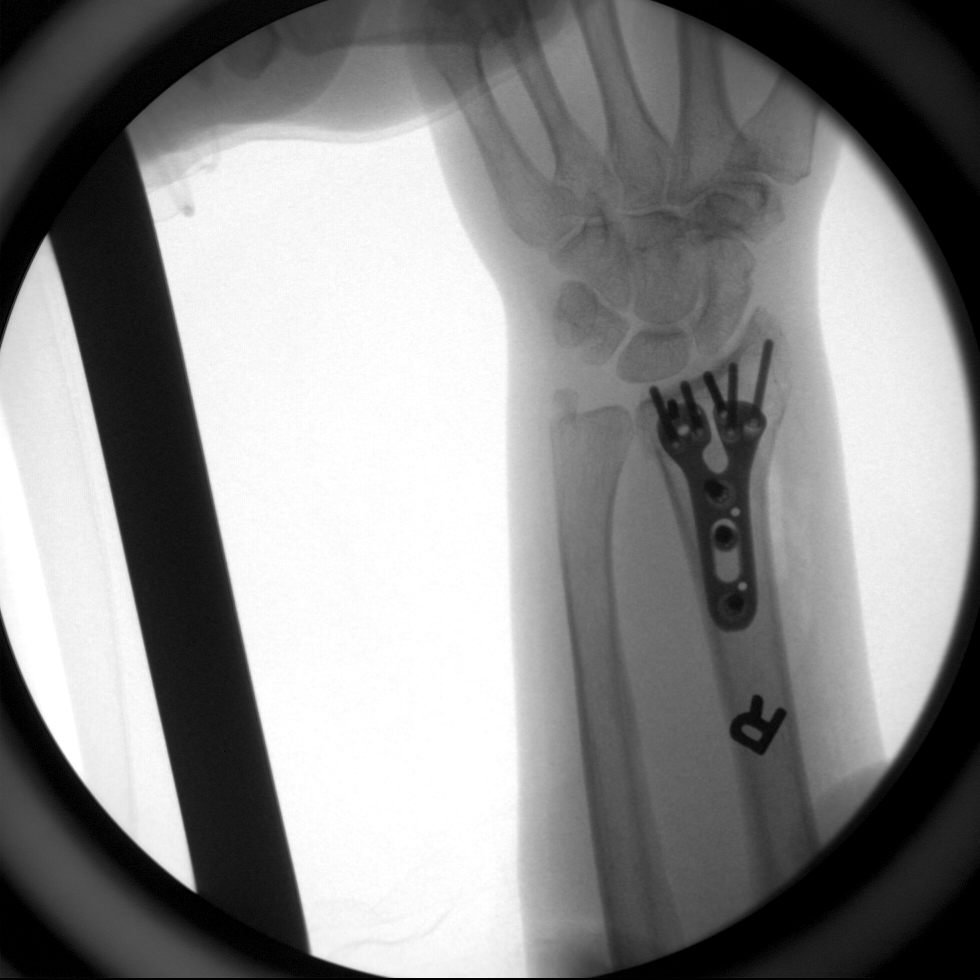
[im 2/3]
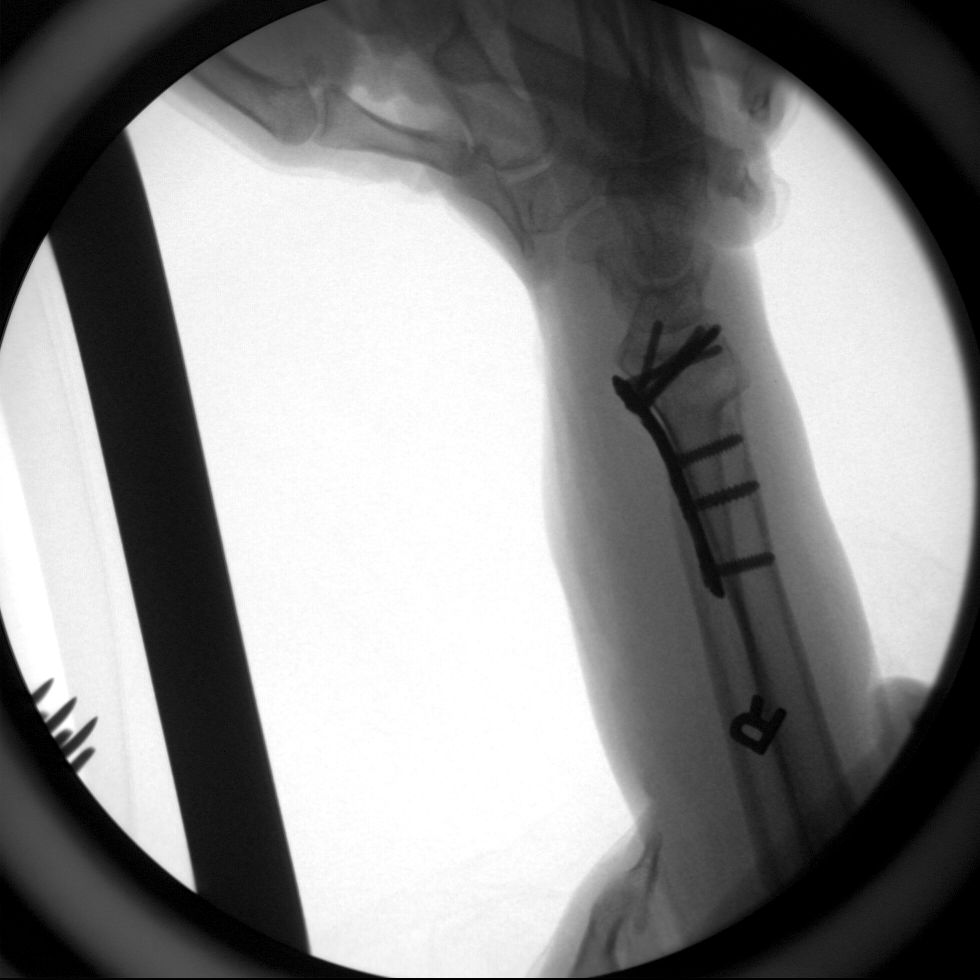
[im 3/3]
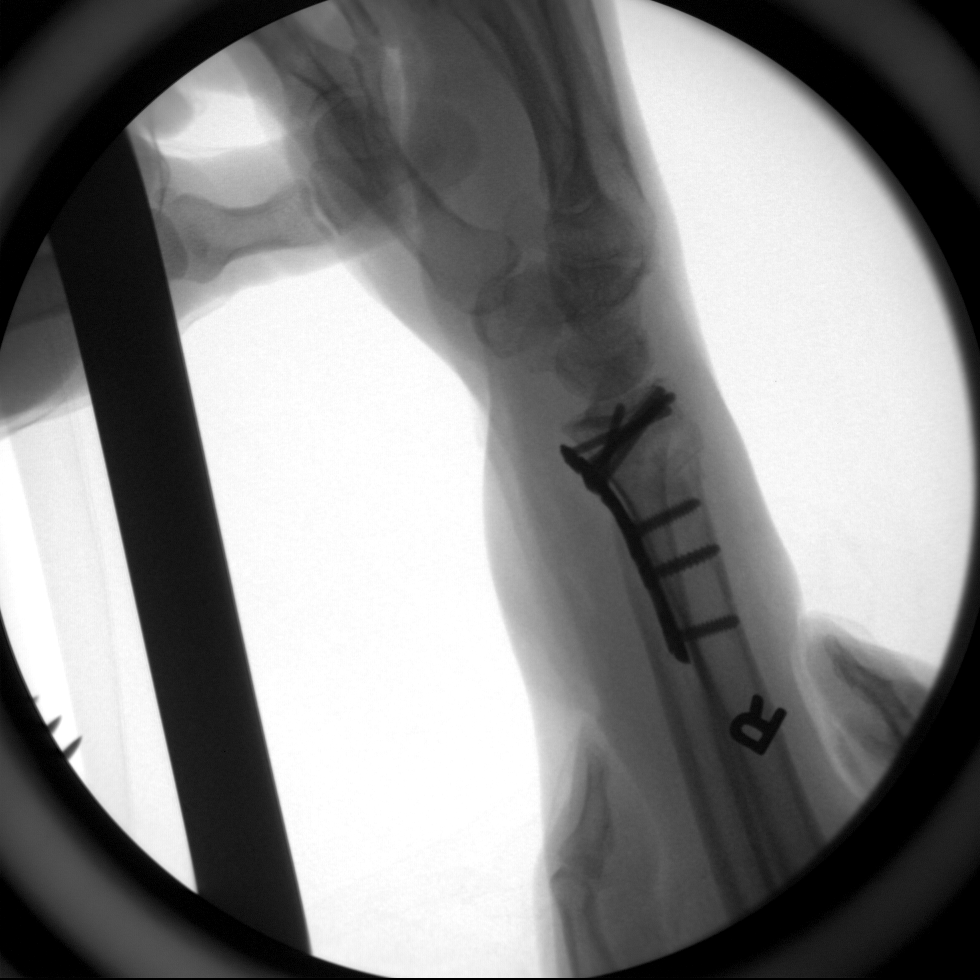

[3 of 3 positions shown; findings below may reference images not displayed]

FINDINGS: Plate screw fixation distal right radial fracture with good anatomic
alignment. Hardware intact. Ulnar styloid fracture again noted.
IMPRESSION: ORIF distal right radius.
